# Patient Record
Sex: Female | Born: 1950 | Race: Black or African American | Hispanic: No | Marital: Single | Smoking: Never smoker
Health system: Southern US, Community
[De-identification: ages and names within clinical notes are randomized; demographics above are authoritative.]

## PROBLEM LIST (undated history)

## (undated) HISTORY — PX: ABDOMINAL HYSTERECTOMY: SHX81

---

## 2015-05-11 ENCOUNTER — Emergency Department
Admission: EM | Admit: 2015-05-11 | Discharge: 2015-05-11 | Disposition: A | Discharge status: Home / Self Care | Payer: BC Managed Care – PPO | Attending: Emergency Medicine | Admitting: Emergency Medicine

## 2015-05-11 ENCOUNTER — Encounter: Payer: Self-pay | Admitting: Emergency Medicine

## 2015-05-11 DIAGNOSIS — F17200 Nicotine dependence, unspecified, uncomplicated: Secondary | ICD-10-CM | POA: Diagnosis not present

## 2015-05-11 DIAGNOSIS — R21 Rash and other nonspecific skin eruption: Secondary | ICD-10-CM | POA: Diagnosis present

## 2015-05-11 DIAGNOSIS — L03119 Cellulitis of unspecified part of limb: Secondary | ICD-10-CM

## 2015-05-11 DIAGNOSIS — L03115 Cellulitis of right lower limb: Secondary | ICD-10-CM | POA: Diagnosis not present

## 2015-05-11 MED ORDER — DEXAMETHASONE SODIUM PHOSPHATE 10 MG/ML IJ SOLN
10.0000 mg | Freq: Once | INTRAMUSCULAR | Status: AC
  Administered 2015-05-11: 10 mg via INTRAMUSCULAR
  Filled 2015-05-11: qty 1

## 2015-05-11 MED ORDER — CEPHALEXIN 500 MG PO CAPS: 500.0000 mg | ORAL_CAPSULE | Freq: Two times a day (BID) | ORAL | Status: AC

## 2015-05-11 MED ORDER — PREDNISONE 10 MG (21) PO TBPK: ORAL_TABLET | ORAL | Status: DC

## 2015-05-11 MED ORDER — HYDROXYZINE HCL 25 MG PO TABS: 25.0000 mg | ORAL_TABLET | Freq: Three times a day (TID) | ORAL | Status: DC | PRN

## 2015-05-11 NOTE — Discharge Instructions (Signed)

## 2015-05-11 NOTE — ED Notes (Signed)
Pt reports rash that started about 1-2 weeks ago and has been spreading.  Describes as painful and itching.  Denies fevers.

## 2015-05-11 NOTE — ED Notes (Signed)
Discussed discharge instructions, prescriptions, and follow-up care with patient. No questions or concerns at this time. Pt stable at discharge.  

## 2015-05-11 NOTE — ED Provider Notes (Signed)
CSN: [id number]     Arrival date & time 05/11/15  1516 History   First MD Initiated Contact with Patient 05/11/15 1604     Chief Complaint  Patient presents with  . Rash     HPI   65 year old female who presents to the emergency department for evaluation of a rash that started on her right lower extremity approximately 2-1/2 weeks ago. She reports that the rash started as a small patch that was very pruritic at night and has spread to both of her lower extremities including her feet, generalized over the lower extremities and is now wrapping around her mid section. She has tried using calamine lotion, hydrocortisone cream, peroxide, and wrapping the next lower extremities at night to prevent scratching. She reports having a long-standing history of eczema states that this rash is completely different from anything she has had in the past. She denies new exposures or new hygiene products. She denies recent illness or recent additions or discontinuation of any medications. No one else in the house has similar symptoms.  History reviewed. No pertinent past medical history. Past Surgical History  Procedure Laterality Date  . Abdominal hysterectomy     History reviewed. No pertinent family history. Social History  Substance Use Topics  . Smoking status: Current Every Day Smoker  . Smokeless tobacco: None  . Alcohol Use: No   OB History    No data available     Review of Systems  Constitutional: Negative for fever.  Respiratory: Negative.   Gastrointestinal: Negative for nausea and vomiting.  Musculoskeletal: Positive for joint swelling.  Skin: Positive for color change and rash.  Neurological: Negative.       Allergies  Review of patient's allergies indicates no known allergies.  Home Medications   Prior to Admission medications   Medication Sig Start Date End Date Taking? Authorizing Provider  cephALEXin (KEFLEX) 500 MG capsule Take 1 capsule (500 mg total) by mouth 2 (two)  times daily. 05/11/15 05/21/15  Chinita Pesterari B Mandrell Vangilder, FNP  hydrOXYzine (ATARAX/VISTARIL) 25 MG tablet Take 1 tablet (25 mg total) by mouth 3 (three) times daily as needed. 05/11/15   Chinita Pesterari B Nykira Reddix, FNP  predniSONE (STERAPRED UNI-PAK 21 TAB) 10 MG (21) TBPK tablet Take 6 tablets on day 1 Take 5 tablets on day 2 Take 4 tablets on day 3 Take 3 tablets on day 4 Take 2 tablets on day 5 Take 1 tablet on day 6 05/11/15   Fraser Busche B Deaundra Dupriest, FNP   BP 128/74 mmHg  Pulse 93  Temp(Src) 97.9 F (36.6 C) (Oral)  Resp 18  Ht 5\' 1"  (1.549 m)  Wt 90.719 kg  BMI 37.81 kg/m2  SpO2 100% Physical Exam  Constitutional: She is oriented to person, place, and time. She appears well-developed and well-nourished.  Eyes: EOM are normal.  Neck: Normal range of motion.  Cardiovascular: Normal rate.   Pulmonary/Chest: Effort normal and breath sounds normal.  Musculoskeletal: Normal range of motion. She exhibits edema.  Neurological: She is alert and oriented to person, place, and time.  Skin: Skin is warm and dry. Rash noted. Rash is maculopapular and vesicular. There is erythema.     Psychiatric: Her behavior is normal. Thought content normal.  Nursing note and vitals reviewed.   ED Course  Procedures (including critical care time) Labs Review Labs Reviewed - No data to display  Imaging Review No results found. I have personally reviewed and evaluated these images and lab results as part of my medical  decision-making.   EKG Interpretation None      MDM   Final diagnoses:  Rash and nonspecific skin eruption  Cellulitis of lower extremity, unspecified laterality    Patient was given 10 mg Decadron IM while in the emergency department and will be discharged home with a prescription for tapered dose of prednisone to be started tomorrow afternoon. She will also be given a prescription of hydroxyzine to be taken especially at night. She was encouraged to follow up with the dermatologist for symptoms that are  not resolving with these medications. She was advised to schedule an appointment with her primary care provider for any general health exam. She was advised to return to the emergency department for symptoms that change or worsen if she is unable to schedule an appointment with primary care or dermatology.    Chinita Pester, FNP 05/11/15 1720  Jeanmarie Plant, MD 05/11/15 2259

## 2017-03-15 ENCOUNTER — Other Ambulatory Visit: Payer: Self-pay

## 2017-03-15 ENCOUNTER — Encounter: Payer: Self-pay | Admitting: *Deleted

## 2017-03-15 ENCOUNTER — Ambulatory Visit (INDEPENDENT_AMBULATORY_CARE_PROVIDER_SITE_OTHER): Payer: Medicare Other

## 2017-03-15 ENCOUNTER — Ambulatory Visit
Admission: EM | Admit: 2017-03-15 | Discharge: 2017-03-15 | Disposition: A | Discharge status: Home / Self Care | Payer: Medicare Other | Attending: Family Medicine | Admitting: Family Medicine

## 2017-03-15 DIAGNOSIS — R05 Cough: Secondary | ICD-10-CM

## 2017-03-15 DIAGNOSIS — R059 Cough, unspecified: Secondary | ICD-10-CM

## 2017-03-15 DIAGNOSIS — R5383 Other fatigue: Secondary | ICD-10-CM | POA: Diagnosis not present

## 2017-03-15 MED ORDER — HYDROCOD POLST-CPM POLST ER 10-8 MG/5ML PO SUER: 5.0000 mL | Freq: Two times a day (BID) | ORAL | 0 refills | Status: DC | PRN

## 2017-03-15 MED ORDER — DOXYCYCLINE HYCLATE 100 MG PO CAPS: 100.0000 mg | ORAL_CAPSULE | Freq: Two times a day (BID) | ORAL | 0 refills | Status: DC

## 2017-03-15 MED ORDER — PREDNISONE 50 MG PO TABS: ORAL_TABLET | ORAL | 0 refills | Status: DC

## 2017-03-15 NOTE — Discharge Instructions (Signed)
This is due to bronchitis (likely acute on chronic).  Medications as prescribed.  Take care  Dr. Adriana Simasook

## 2017-03-15 NOTE — ED Triage Notes (Signed)
Patient started having symptoms of cough, congestion, and fatigue 4 days ago.

## 2017-03-15 NOTE — ED Provider Notes (Signed)
[facility]   CSN: [id number] Arrival date & time: 03/15/17  1220  History   Chief Complaint Chief Complaint  Patient presents with  . Cough  . Fatigue   HPI  67 year old female presents with the above complaints..  Patient reports a 3-4-day history of cough.  Productive.  No associated shortness of breath.  Associated fatigue.  She denies any fever but is febrile here.  She reports recent sick contact in the flu.  No known exacerbating relieving factors.  No other associated symptoms.  No other complaints.  Past Surgical History:  Procedure Laterality Date  . ABDOMINAL HYSTERECTOMY     OB History    No data available     Home Medications    Prior to Admission medications   Medication Sig Start Date End Date Taking? Authorizing Provider  chlorpheniramine-HYDROcodone (TUSSIONEX PENNKINETIC ER) 10-8 MG/5ML SUER Take 5 mLs by mouth every 12 (twelve) hours as needed. 03/15/17   Tommie Sams, DO  doxycycline (VIBRAMYCIN) 100 MG capsule Take 1 capsule (100 mg total) by mouth 2 (two) times daily. 03/15/17   Tommie Sams, DO  predniSONE (DELTASONE) 50 MG tablet 1 tablet daily x 5 days. 03/15/17   Tommie Sams, DO   Family History Family History  Problem Relation Age of Onset  . Hypertension Mother    Social History Social History   Tobacco Use  . Smoking status: Current Some Day Smoker  . Smokeless tobacco: Never Used  Substance Use Topics  . Alcohol use: No  . Drug use: No    Allergies   Patient has no known allergies.   Review of Systems Review of Systems  Constitutional: Positive for fatigue.  Respiratory: Positive for cough. Negative for shortness of breath.    Physical Exam Triage Vital Signs ED Triage Vitals  Enc Vitals Group     BP 03/15/17 1320 (!) 117/46     Pulse Rate 03/15/17 1320 95     Resp 03/15/17 1320 16     Temp 03/15/17 1320 (!) 101 F (38.3 C)     Temp Source 03/15/17 1320 Oral     SpO2 03/15/17 1320 94 %     Weight 03/15/17  1322 210 lb (95.3 kg)     Height 03/15/17 1322 5\' 4"  (1.626 m)     Head Circumference --      Peak Flow --      Pain Score 03/15/17 1322 0     Pain Loc --      Pain Edu? --      Excl. in [place]? --    Updated Vital Signs BP (!) 117/46 (BP Location: Right Arm)   Pulse 95   Temp (!) 101 F (38.3 C) (Oral)   Resp 16   Ht 5\' 4"  (1.626 m)   Wt 210 lb (95.3 kg)   SpO2 94%   BMI 36.05 kg/m     Physical Exam  Constitutional: She is oriented to person, place, and time. She appears well-developed and well-nourished. No distress.  HENT:  Head: Normocephalic and atraumatic.  Mouth/Throat: Oropharynx is clear and moist.  Eyes: Conjunctivae are normal. Right eye exhibits no discharge. Left eye exhibits no discharge.  Cardiovascular: Normal rate and regular rhythm.  Pulmonary/Chest: Effort normal.  Basilar crackles noted.  Neurological: She is alert and oriented to person, place, and time.  Psychiatric: She has a normal mood and affect. Her behavior is normal.  Vitals reviewed.  UC Treatments / Results  Labs (  all labs ordered are listed, but only abnormal results are displayed) Labs Reviewed - No data to display  EKG  EKG Interpretation None       Radiology Dg Chest 2 View  Result Date: 03/15/2017 CLINICAL DATA:  Cough.  Fever.  Smoker with history of asthma. EXAM: CHEST  2 VIEW COMPARISON:  None. FINDINGS: Moderate mid and lower thoracic spondylosis. Midline trachea. Borderline cardiomegaly. Atherosclerosis in the transverse aorta. Mediastinal contours otherwise within normal limits. No pleural effusion or pneumothorax. Mild to moderate basilar predominant interstitial thickening. No lobar consolidation. IMPRESSION: No acute cardiopulmonary disease. Peribronchial thickening which may relate to chronic bronchitis or smoking. Aortic Atherosclerosis (ICD10-I70.0). Electronically Signed   By: Jeronimo GreavesKyle  Talbot M.D.   On: 03/15/2017 14:27    Procedures Procedures (including critical care  time)  Medications Ordered in UC Medications - No data to display   Initial Impression / Assessment and Plan / UC Course  I have reviewed the triage vital signs and the nursing notes.  Pertinent labs & imaging results that were available during my care of the patient were reviewed by me and considered in my medical decision making (see chart for details).     67 year old female presents with cough and fatigue.  Given fever and lung findings, x-ray obtained and revealed findings of chronic bronchitis.  I suspect she has COPD due to underlying tobacco abuse.  As a result, I am treating her with doxycycline, prednisone, Tussionex.  Final Clinical Impressions(s) / UC Diagnoses   Final diagnoses:  Cough    ED Discharge Orders        Ordered    doxycycline (VIBRAMYCIN) 100 MG capsule  2 times daily     03/15/17 1440    predniSONE (DELTASONE) 50 MG tablet     03/15/17 1440    chlorpheniramine-HYDROcodone (TUSSIONEX PENNKINETIC ER) 10-8 MG/5ML SUER  Every 12 hours PRN     03/15/17 1440     Controlled Substance Prescriptions  Controlled Substance Registry consulted? No   Tommie SamsCook, Christen Wardrop G, DO 03/15/17 1500

## 2018-10-04 ENCOUNTER — Other Ambulatory Visit: Payer: Self-pay | Admitting: Internal Medicine

## 2018-10-04 DIAGNOSIS — Z20822 Contact with and (suspected) exposure to covid-19: Secondary | ICD-10-CM

## 2018-10-05 LAB — NOVEL CORONAVIRUS, NAA: SARS-CoV-2, NAA: NOT DETECTED

## 2019-02-05 IMAGING — CR DG CHEST 2V
3 series · 3 of 3 positions shown · non-contrast
Comparison: None.

CLINICAL DATA: Cough.  Fever.  Smoker with history of asthma.

EXAM:
CHEST  2 VIEW

[chest pa]
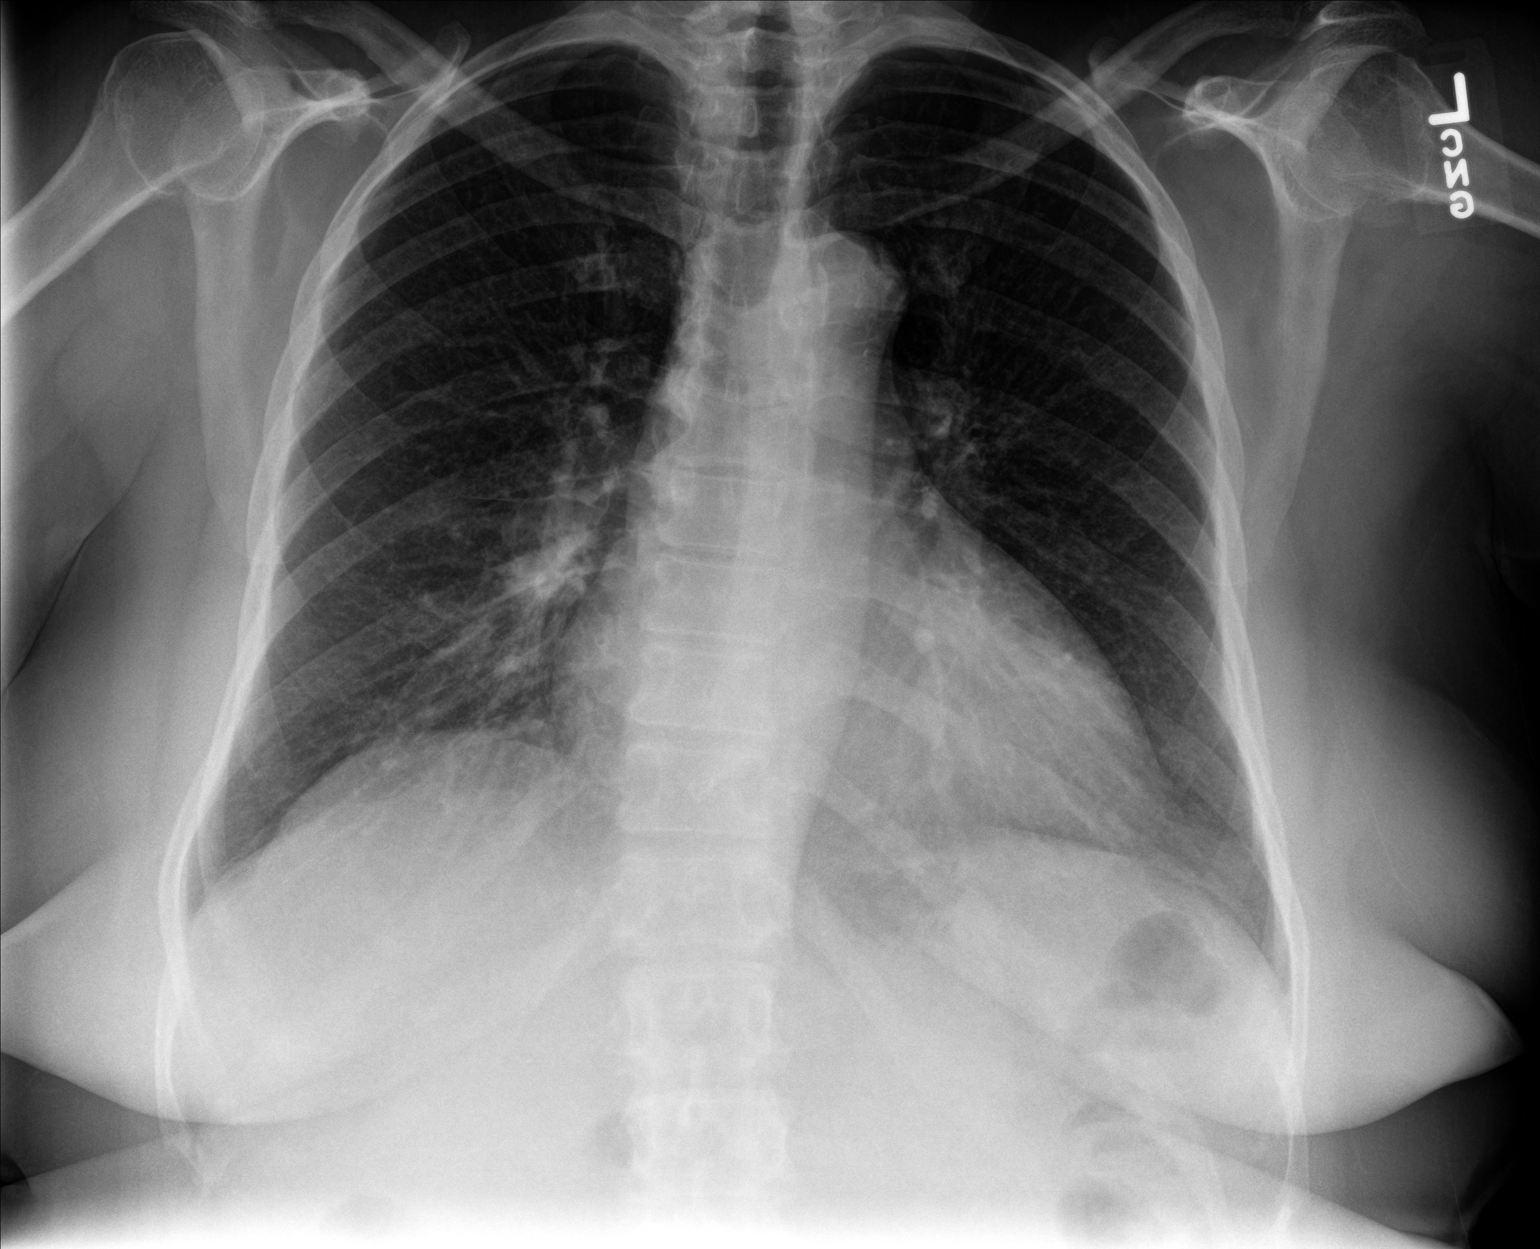

[chest lat (1 of 2)]
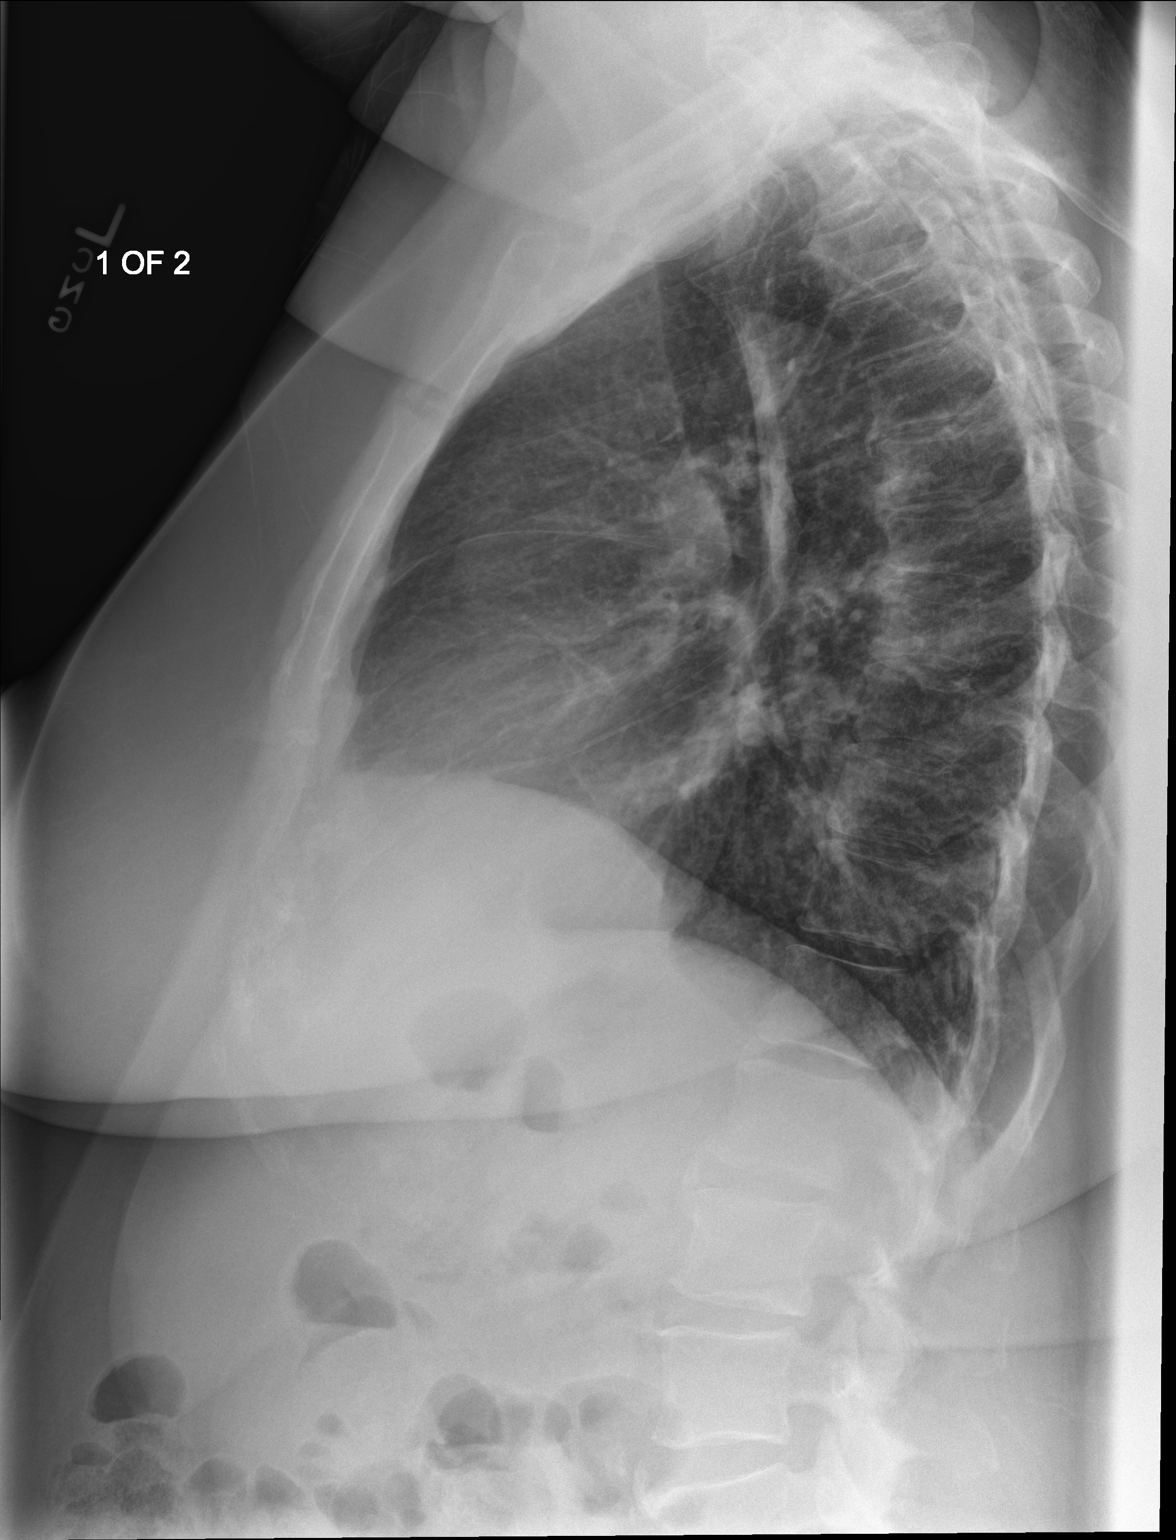

[chest lat (2 of 2)]
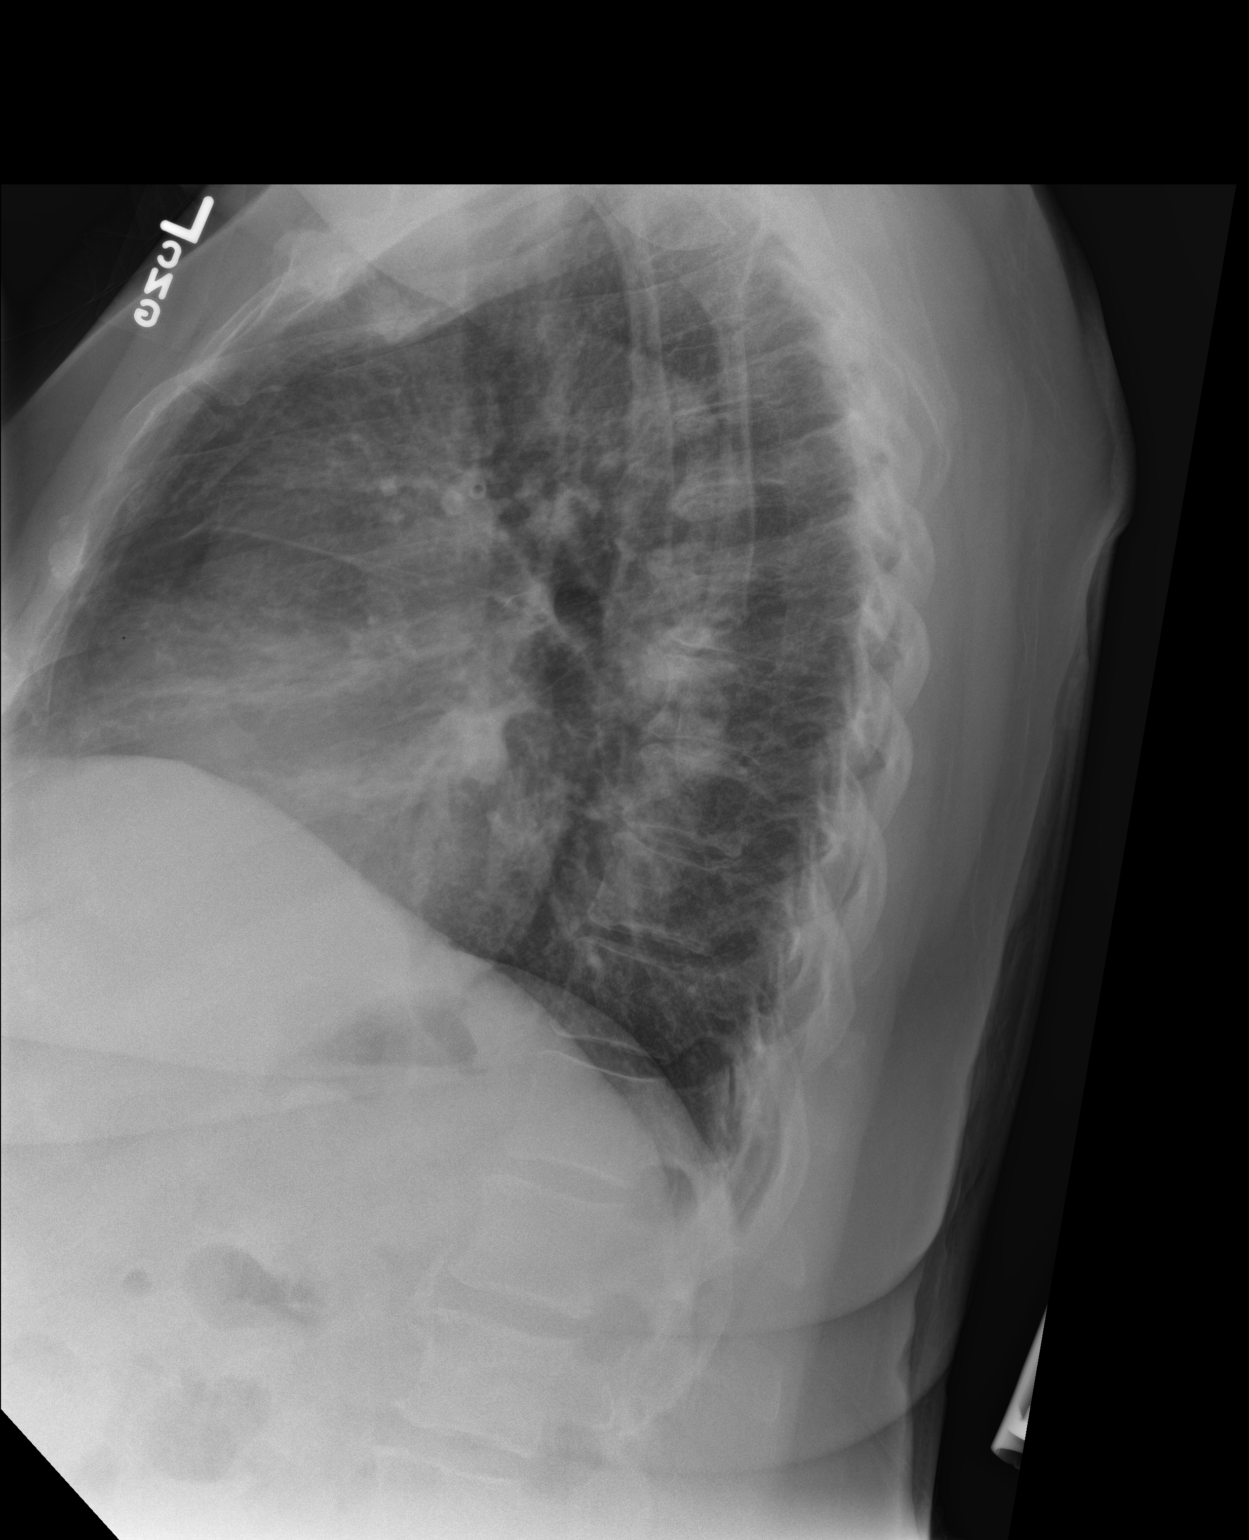

[3 of 3 positions shown; findings below may reference images not displayed]

FINDINGS: Moderate mid and lower thoracic spondylosis. Midline trachea.
Borderline cardiomegaly. Atherosclerosis in the transverse aorta.
Mediastinal contours otherwise within normal limits. No pleural
effusion or pneumothorax. Mild to moderate basilar predominant
interstitial thickening. No lobar consolidation.
IMPRESSION: No acute cardiopulmonary disease.

Peribronchial thickening which may relate to chronic bronchitis or
smoking.

Aortic Atherosclerosis ([id number].W).

## 2019-06-17 ENCOUNTER — Emergency Department
Admission: EM | Admit: 2019-06-17 | Discharge: 2019-06-18 | Disposition: A | Discharge status: Home / Self Care | Payer: Medicare PPO | Attending: Student | Admitting: Student

## 2019-06-17 ENCOUNTER — Other Ambulatory Visit: Payer: Self-pay

## 2019-06-17 ENCOUNTER — Encounter: Payer: Self-pay | Admitting: Emergency Medicine

## 2019-06-17 ENCOUNTER — Emergency Department: Payer: Medicare PPO

## 2019-06-17 DIAGNOSIS — R059 Cough, unspecified: Secondary | ICD-10-CM

## 2019-06-17 DIAGNOSIS — R05 Cough: Secondary | ICD-10-CM

## 2019-06-17 DIAGNOSIS — R5383 Other fatigue: Secondary | ICD-10-CM | POA: Diagnosis present

## 2019-06-17 DIAGNOSIS — R531 Weakness: Secondary | ICD-10-CM

## 2019-06-17 DIAGNOSIS — U071 COVID-19: Secondary | ICD-10-CM | POA: Diagnosis not present

## 2019-06-17 DIAGNOSIS — F17200 Nicotine dependence, unspecified, uncomplicated: Secondary | ICD-10-CM | POA: Insufficient documentation

## 2019-06-17 DIAGNOSIS — Z20822 Contact with and (suspected) exposure to covid-19: Secondary | ICD-10-CM

## 2019-06-17 LAB — CBC
HCT: 44.8 % (ref 36.0–46.0)
Hemoglobin: 14.5 g/dL (ref 12.0–15.0)
MCH: 25.3 pg — ABNORMAL LOW (ref 26.0–34.0)
MCHC: 32.4 g/dL (ref 30.0–36.0)
MCV: 78.3 fL — ABNORMAL LOW (ref 80.0–100.0)
Platelets: 142 10*3/uL — ABNORMAL LOW (ref 150–400)
RBC: 5.72 MIL/uL — ABNORMAL HIGH (ref 3.87–5.11)
RDW: 15.3 % (ref 11.5–15.5)
WBC: 4.2 10*3/uL (ref 4.0–10.5)
nRBC: 0 % (ref 0.0–0.2)

## 2019-06-17 LAB — URINALYSIS, COMPLETE (UACMP) WITH MICROSCOPIC
Bilirubin Urine: NEGATIVE
Glucose, UA: NEGATIVE mg/dL
Hgb urine dipstick: NEGATIVE
Ketones, ur: 20 mg/dL — AB
Leukocytes,Ua: NEGATIVE
Nitrite: NEGATIVE
Protein, ur: >=300 mg/dL — AB
Specific Gravity, Urine: 1.032 — ABNORMAL HIGH (ref 1.005–1.030)
pH: 5 (ref 5.0–8.0)

## 2019-06-17 LAB — BASIC METABOLIC PANEL
Anion gap: 12 (ref 5–15)
BUN: 19 mg/dL (ref 8–23)
CO2: 24 mmol/L (ref 22–32)
Calcium: 8.1 mg/dL — ABNORMAL LOW (ref 8.9–10.3)
Chloride: 101 mmol/L (ref 98–111)
Creatinine, Ser: 1.22 mg/dL — ABNORMAL HIGH (ref 0.44–1.00)
GFR calc Af Amer: 53 mL/min — ABNORMAL LOW (ref >60)
GFR calc non Af Amer: 45 mL/min — ABNORMAL LOW (ref >60)
Glucose, Bld: 80 mg/dL (ref 70–99)
Potassium: 3.4 mmol/L — ABNORMAL LOW (ref 3.5–5.1)
Sodium: 137 mmol/L (ref 135–145)

## 2019-06-17 MED ORDER — SODIUM CHLORIDE 0.9% FLUSH: 3.0000 mL | Freq: Once | INTRAVENOUS | Status: DC

## 2019-06-17 NOTE — ED Notes (Signed)
Pt again updated on wait. Pt continues to states she would like to leave. Pt informed that she is free to leave at any time. Pt's daughter at bedside encouraging pt to stay. Pt informed that if she chooses to leave to please let staff know. Pt verbalizes understanding.

## 2019-06-17 NOTE — ED Provider Notes (Signed)
[facility] Emergency Department Provider Note  ____________________________________________   First MD Initiated Contact with Patient 06/17/19 2259     (approximate)  I have reviewed the triage vital signs and the nursing notes.  History  Chief Complaint Dizziness, Fatigue, and Cough    HPI Sharon Ayala is a 69 y.o. female no significant medical history, though admits that she does not regularly see doctors, who presents to the emergency department for 1 week of generalized fatigue, lightheadedness, non-productive cough, sinus congestion/pressure, decreased appetite.  Family (present at bedside) noticed she seemed overall more weak and fatigued than normal today (got tired with walking which is atypical for her) and insisted she seek care.  Reports symptoms have been constant since onset for the past week.  Has not taken anything for her symptoms. Nothing seems to make it better or worse. Reports subjective fever today.  No chest pain, vomiting, diarrhea, dysuria.  No isolated weakness, numbness, tingling.  No known sick contacts.  Has not been vaccinated against COVID.  Self swabbed for COVID testing at a [facility] a few days ago but has not received the results yet.   Past Medical Hx History reviewed. No pertinent past medical history.  Problem List There are no problems to display for this patient.   Past Surgical Hx Past Surgical History:  Procedure Laterality Date  . ABDOMINAL HYSTERECTOMY      Medications Prior to Admission medications   Medication Sig Start Date End Date Taking? Authorizing Provider  chlorpheniramine-HYDROcodone (TUSSIONEX PENNKINETIC ER) 10-8 MG/5ML SUER Take 5 mLs by mouth every 12 (twelve) hours as needed. 03/15/17   Coral Spikes, DO  doxycycline (VIBRAMYCIN) 100 MG capsule Take 1 capsule (100 mg total) by mouth 2 (two) times daily. 03/15/17   Coral Spikes, DO  predniSONE (DELTASONE) 50 MG tablet 1 tablet daily x 5 days. 03/15/17    Coral Spikes, DO    Allergies Patient has no known allergies.  Family Hx Family History  Problem Relation Age of Onset  . Hypertension Mother     Social Hx Social History   Tobacco Use  . Smoking status: Current Some Day Smoker  . Smokeless tobacco: Never Used  Substance Use Topics  . Alcohol use: No  . Drug use: No      Review of Systems  Constitutional: Positive for subjective fever, fatigue, lightheadedness, decreased appetite. Eyes: Negative for visual changes. ENT: Negative for sore throat. Cardiovascular: Negative for chest pain. Respiratory: Positive for cough. Gastrointestinal: Negative for nausea. Negative for vomiting.  Genitourinary: Negative for dysuria. Musculoskeletal: Negative for leg swelling. Skin: Negative for rash. Neurological: Negative for headaches.   Physical Exam  Vital Signs: ED Triage Vitals  Enc Vitals Group     BP 06/17/19 1644 (!) 121/94     Pulse Rate 06/17/19 1644 88     Resp 06/17/19 1644 18     Temp 06/17/19 1644 99.7 F (37.6 C)     Temp Source 06/17/19 1644 Oral     SpO2 06/17/19 1644 100 %     Weight 06/17/19 1645 280 lb (127 kg)     Height 06/17/19 1645 5\' 4"  (1.626 m)     Head Circumference --      Peak Flow --      Pain Score 06/17/19 1644 8     Pain Loc --      Pain Edu? --      Excl. in [place]? --     Constitutional:  Alert and oriented. Well appearing. NAD.  Head: Normocephalic. Atraumatic.  Nontender to palpation of maxillary and frontal sinuses. Eyes: Conjunctivae clear. Sclera anicteric. Pupils equal and symmetric. Ears: Cerumen in bilateral EAC. Nose: No masses or lesions. No congestion or rhinorrhea. Mouth/Throat: Wearing mask.  Neck: No stridor. Trachea midline.  Cardiovascular: Normal rate, regular rhythm. Extremities well perfused. Respiratory: Normal respiratory effort.  Lungs CTAB. Gastrointestinal: Soft. Non-distended. Non-tender.  Genitourinary: Deferred. Musculoskeletal: No lower extremity edema.  No deformities. Neurologic:  Normal speech and language. No gross focal or lateralizing neurologic deficits are appreciated. Alert and oriented.  Face symmetric.  Tongue midline.  Cranial nerves II through XII intact. UE and LE strength 5/5 and symmetric. UE and LE SILT.  Skin: Skin is warm, dry and intact. No rash noted. Psychiatric: Mood and affect are appropriate for situation.  EKG  Personally reviewed and interpreted by myself.   Date: 06/17/19 Time: 1649 Rate: 87 Rhythm: sinus Axis: left Intervals: WNL TWI III, aVF No priors No STEMI    Radiology  Personally reviewed available imaging myself.   CXR - IMPRESSION: Bilateral hazy and interstitial densities may represent edema or pneumonia.     Procedures  Procedure(s) performed (including critical care):  Procedures   Initial Impression / Assessment and Plan / MDM / ED Course  69 y.o. female who presents to the ED for 1 week of generalized fatigue, cough, sinus congestion, decreased appetite.  Ddx: COVID, UTI, electrolyte abnormality, anemia, other viral process.  NT to palpation of the sinuses, doubt acute bacterial sinusitis.  Will plan for labs, repeat COVID swab, XR  Labs reveal slightly elevated creatinine at 1.22, unclear what her baseline is that she does not regularly seek care, but reasonable to assume mild AKI in the setting of her decreased intake, advised adequate oral hydration.  No significant anemia.  Mild thrombocytopenia.  Urine with small amount of ketones, consistent with her decreased intake, no evidence of infection.  Clinical Course as of Jun 17 249  Mon Jun 18, 2019  0027 CXR reveals bilateral hazy and interstitial densities may represent edema or pneumonia.  In the setting of her symptoms, high suspicion for COVID.  Added on procalcitonin and BNP.  At this time, patient does not wish to wait for her COVID results and prefers to be discharged since it is getting late.  Given her blood work  is otherwise without actionable derangements and she has remained stable from a hemodynamic and respiratory perspective, feels is reasonable.  We will plan to discharge with prescription for antibiotics for possible atypical pneumonia to take pending her COVID swab.  If her swab is negative, will assume her chest x-ray findings represent bacterial etiology & atypical PNA (unlikely edema given normal BNP) and she should complete treatment with antibiotics.  If her swab is positive, CXR findings would be explained by this and she would not need the antibiotics.  We will also give a referral for her to establish care and follow-up with a PCP.  Patient voices understanding and is comfortable w/ plan.  Given return precautions.   [SM]    Clinical Course User Index [SM] Miguel Aschoff., MD      _______________________________   As part of my medical decision making I have reviewed available labs, radiology tests, reviewed old records/performed chart review, obtained additional history from family.    Final Clinical Impression(s) / ED Diagnosis  Final diagnoses:  Weakness  Cough  Suspected COVID-19 virus infection  Note:  This document was prepared using Dragon voice recognition software and may include unintentional dictation errors.   Miguel Aschoff., MD 06/18/19 [phone]

## 2019-06-17 NOTE — ED Triage Notes (Signed)
Pt to ER states intermittent dizziness, cough and fatigue for last week.  Pt states "at first I thought it was just allergies".  Pt denies chest pain, shortness of breath, n/v. Reports headache and ears are stuffy to the point of effecting her hearing.

## 2019-06-18 ENCOUNTER — Telehealth: Payer: Self-pay

## 2019-06-18 LAB — RESPIRATORY PANEL BY RT PCR (FLU A&B, COVID)
Influenza A by PCR: NEGATIVE
Influenza B by PCR: NEGATIVE
SARS Coronavirus 2 by RT PCR: POSITIVE — AB

## 2019-06-18 LAB — PROCALCITONIN: Procalcitonin: 0.18 ng/mL

## 2019-06-18 LAB — BRAIN NATRIURETIC PEPTIDE: B Natriuretic Peptide: 28 pg/mL (ref 0.0–100.0)

## 2019-06-18 MED ORDER — AMOXICILLIN-POT CLAVULANATE 875-125 MG PO TABS: 1.0000 | ORAL_TABLET | Freq: Two times a day (BID) | ORAL | 0 refills | Status: AC

## 2019-06-18 MED ORDER — AZITHROMYCIN 250 MG PO TABS: 250.0000 mg | ORAL_TABLET | Freq: Every day | ORAL | 0 refills | Status: AC

## 2019-06-18 NOTE — ED Notes (Signed)
06/18/19 02:55 Attempted to notify patient of positive Covid results but no answer at this time.

## 2019-06-18 NOTE — Telephone Encounter (Signed)
Pt notified of positive COVID-19 test results. Pt verbalized understanding. Pt reports that they are feeling better.Pt advised to remain in self quarantine until at least 10 days since symptom onset And 3 consecutive days fever free without antipyretics And improvement in respiratory symptoms. Patient advised to utilize over the counter medications to treat symptoms. Pt advised to seek treatment in the ED if respiratory issues/distress develops.Pt advised they should only leave home to seek and medical care and must wear a mask in public. Pt instructed to limit contact with family members or caregivers in the home. Pt advised to practice social distancing and to continue to use good preventative care measures such has frequent hand washing, staying out of crowds and cleaning hard surfaces frequently touched in the home.Pt informed that the health department will likely follow up and may have additional recommendations. Will notify  [facility].

## 2019-06-18 NOTE — Discharge Instructions (Addendum)
Thank you for letting us take care of you in the emergency department today. At this time, your COVID test is pending. They should result in the next few hours. You can check your MyChart online for the results, you will also receive a call if they are positive.   If your swab is POSITIVE, you do not need to take the antibiotics. If the swab is NEGATIVE please fill and take your antibiotics.   Please continue to take any regular, prescribed medications.   Please follow up with: Your primary care doctor to review your ER visit and follow up on your symptoms.    Please return to the ER for any new or worsening symptoms.

## 2021-05-09 IMAGING — CR DG CHEST 2V
2 series · 2 of 2 positions shown · non-contrast
Comparison: Chest radiograph dated 03/15/2017

CLINICAL DATA: 68-year-old female with shortness of breath.

EXAM:
CHEST - 2 VIEW

[chest pa]
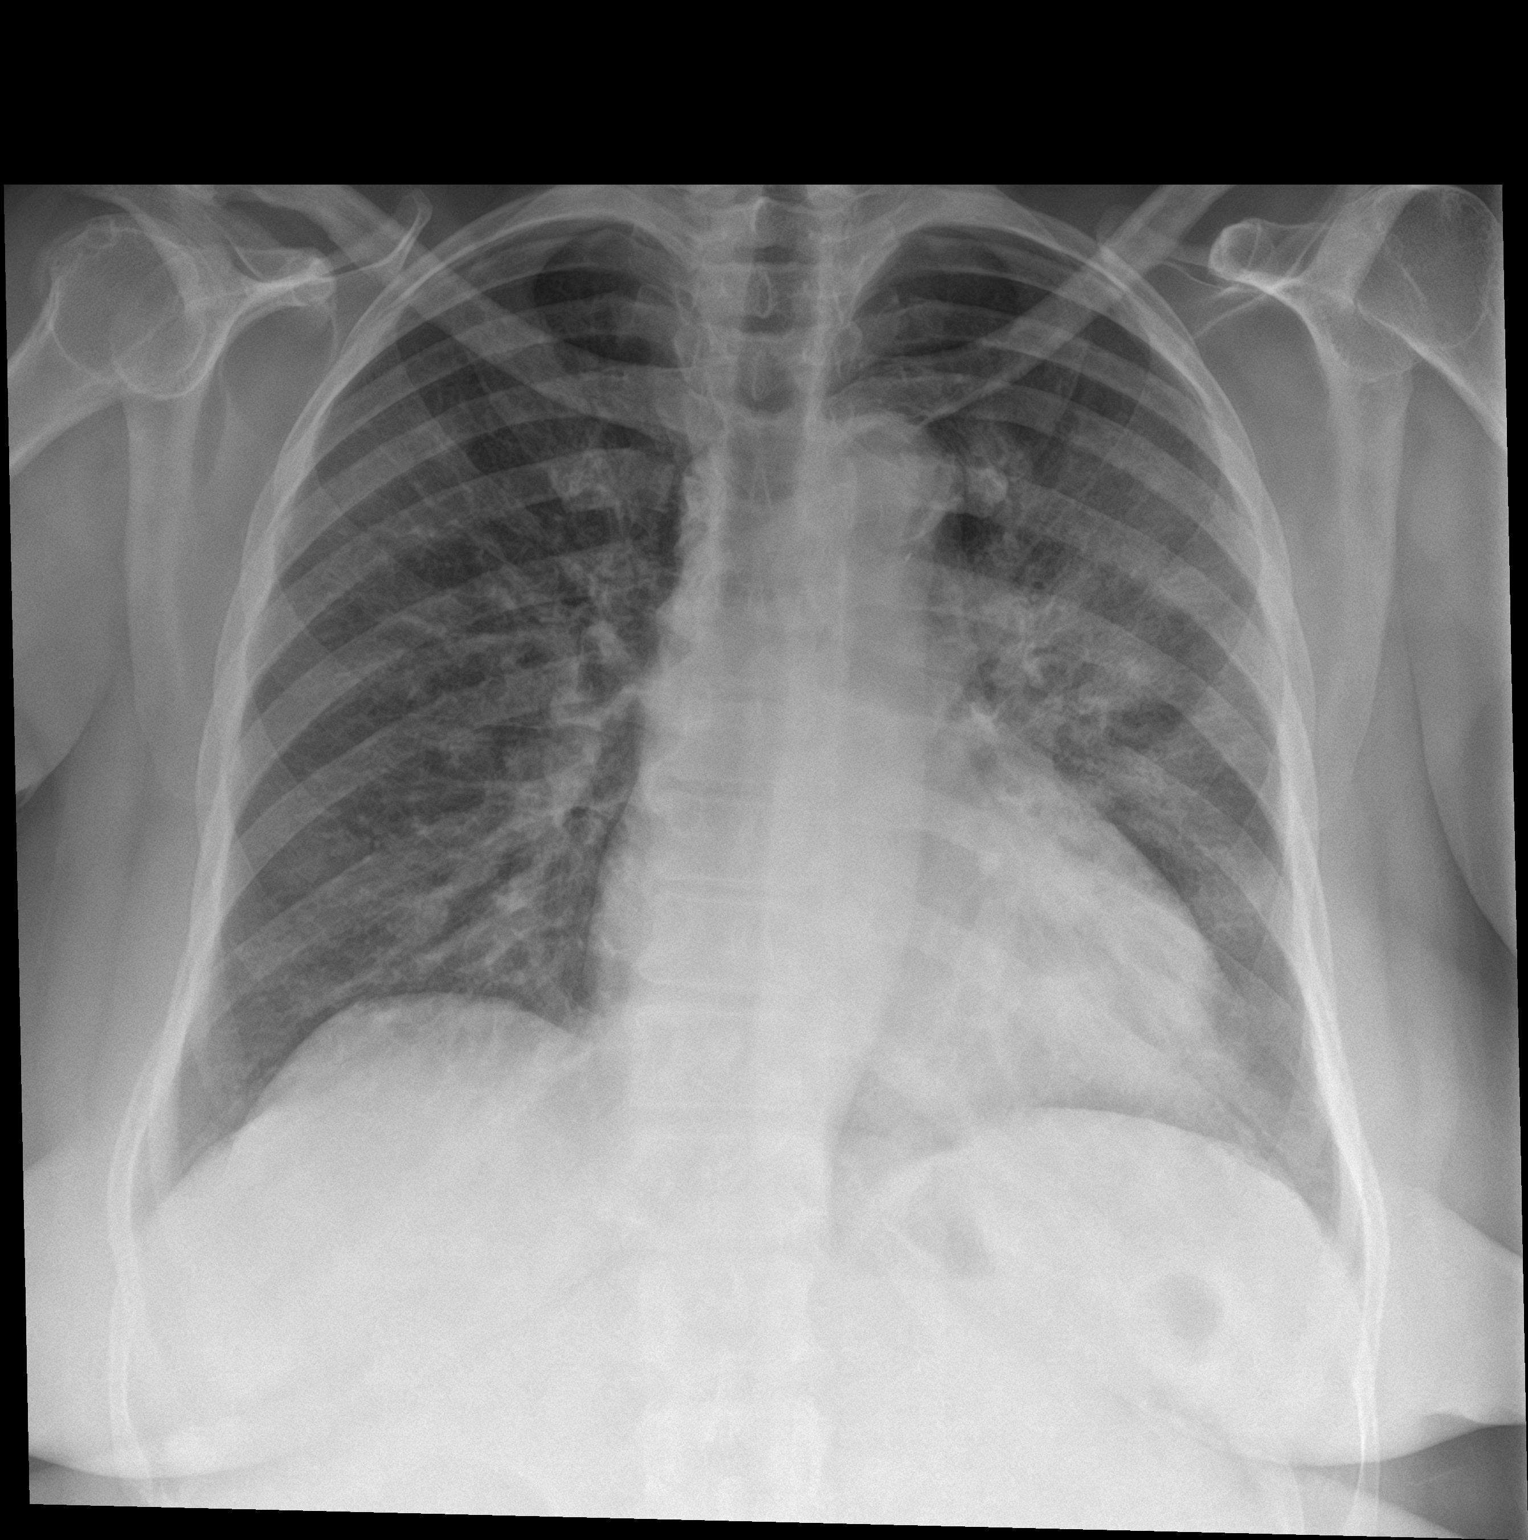

[chest lat]
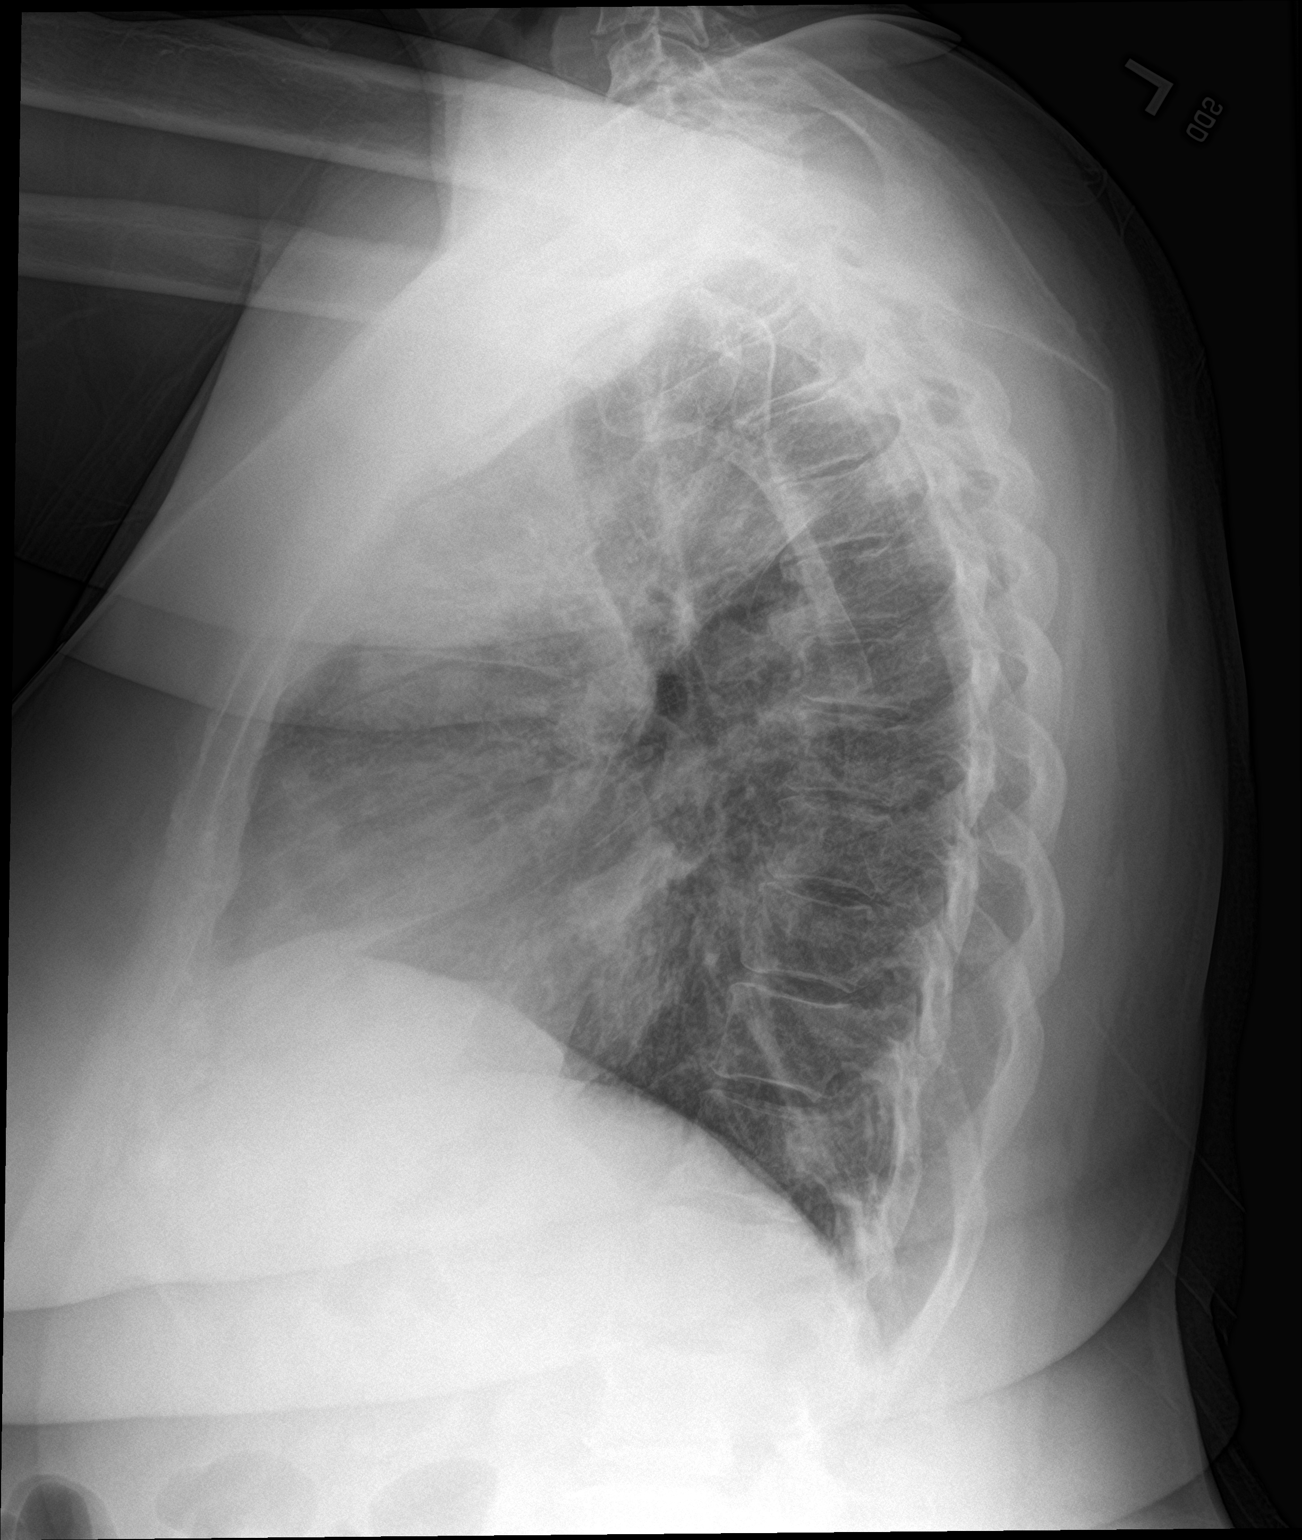

[2 of 2 positions shown; findings below may reference images not displayed]

FINDINGS: Bilateral hazy and interstitial densities may represent edema or
pneumonia. Clinical correlation is recommended. There is no focal
consolidation, pleural effusion, pneumothorax. Borderline
cardiomegaly. Atherosclerotic calcification of the aorta. No acute
osseous pathology.
IMPRESSION: Bilateral hazy and interstitial densities may represent edema or
pneumonia.

## 2022-10-26 ENCOUNTER — Ambulatory Visit
Admission: EM | Admit: 2022-10-26 | Discharge: 2022-10-26 | Disposition: A | Discharge status: Home / Self Care | Payer: Medicare PPO | Attending: Emergency Medicine | Admitting: Emergency Medicine

## 2022-10-26 DIAGNOSIS — U071 COVID-19: Secondary | ICD-10-CM | POA: Insufficient documentation

## 2022-10-26 DIAGNOSIS — F17200 Nicotine dependence, unspecified, uncomplicated: Secondary | ICD-10-CM | POA: Insufficient documentation

## 2022-10-26 DIAGNOSIS — R490 Dysphonia: Secondary | ICD-10-CM | POA: Diagnosis present

## 2022-10-26 LAB — SARS CORONAVIRUS 2 BY RT PCR: SARS Coronavirus 2 by RT PCR: POSITIVE — AB

## 2022-10-26 NOTE — ED Triage Notes (Signed)
Patient states that she woke up hoarse this morning. No sore throat.

## 2022-10-26 NOTE — ED Provider Notes (Signed)
[facility]    CSN: [id number] Arrival date & time: 10/26/22  1743      History   Chief Complaint No chief complaint on file.   HPI Sharon Ayala is a 72 y.o. female.   72 year old female, Sharon Ayala, presents to urgent care for hoarseness that she woke up with this morning.  Daughter sent patient to urgent care for COVID test.  The history is provided by the patient. No language interpreter was used.    History reviewed. No pertinent past medical history.  Patient Active Problem List   Diagnosis Date Noted   Hoarseness 10/26/2022   Smoker 10/26/2022    Past Surgical History:  Procedure Laterality Date   ABDOMINAL HYSTERECTOMY      OB History   No obstetric history on file.      Home Medications    Prior to Admission medications   Medication Sig Start Date End Date Taking? Authorizing Provider  chlorpheniramine-HYDROcodone (TUSSIONEX PENNKINETIC ER) 10-8 MG/5ML SUER Take 5 mLs by mouth every 12 (twelve) hours as needed. 03/15/17   Tommie Sams, DO  doxycycline (VIBRAMYCIN) 100 MG capsule Take 1 capsule (100 mg total) by mouth 2 (two) times daily. 03/15/17   Tommie Sams, DO  predniSONE (DELTASONE) 50 MG tablet 1 tablet daily x 5 days. 03/15/17   Tommie Sams, DO    Family History Family History  Problem Relation Age of Onset   Hypertension Mother     Social History Social History   Tobacco Use   Smoking status: Some Days   Smokeless tobacco: Never  Vaping Use   Vaping status: Never Used  Substance Use Topics   Alcohol use: No   Drug use: No     Allergies   Patient has no known allergies.   Review of Systems Review of Systems  Constitutional:  Negative for fever.  HENT:  Positive for voice change.   All other systems reviewed and are negative.    Physical Exam Triage Vital Signs ED Triage Vitals  Encounter Vitals Group     BP 10/26/22 1815 (!) 183/60     Systolic BP Percentile --      Diastolic BP Percentile --       Pulse Rate 10/26/22 1815 73     Resp 10/26/22 1815 18     Temp 10/26/22 1815 98.4 F (36.9 C)     Temp Source 10/26/22 1815 Oral     SpO2 10/26/22 1815 98 %     Weight --      Height --      Head Circumference --      Peak Flow --      Pain Score 10/26/22 1814 0     Pain Loc --      Pain Education --      Exclude from Growth Chart --    No data found.  Updated Vital Signs BP (!) 183/60 (BP Location: Left Arm)   Pulse 73   Temp 98.4 F (36.9 C) (Oral)   Resp 18   SpO2 98%   Visual Acuity Right Eye Distance:   Left Eye Distance:   Bilateral Distance:    Right Eye Near:   Left Eye Near:    Bilateral Near:     Physical Exam Vitals and nursing note reviewed.  Constitutional:      Appearance: Normal appearance. She is well-developed and well-groomed.  Cardiovascular:     Rate and Rhythm: Normal rate and  regular rhythm.     Pulses: Normal pulses.     Heart sounds: Normal heart sounds.  Pulmonary:     Effort: Pulmonary effort is normal.     Breath sounds: Normal breath sounds and air entry.  Neurological:     General: No focal deficit present.     Mental Status: She is alert and oriented to person, place, and time.     GCS: GCS eye subscore is 4. GCS verbal subscore is 5. GCS motor subscore is 6.  Psychiatric:        Attention and Perception: Attention normal.        Mood and Affect: Mood normal.        Speech: Speech normal.        Behavior: Behavior normal. Behavior is cooperative.      UC Treatments / Results  Labs (all labs ordered are listed, but only abnormal results are displayed) Labs Reviewed  SARS CORONAVIRUS 2 BY RT PCR - Abnormal; Notable for the following components:      Result Value   SARS Coronavirus 2 by RT PCR POSITIVE (*)    All other components within normal limits    EKG   Radiology No results found.  Procedures Procedures (including critical care time)  Medications Ordered in UC Medications - No data to display  Initial  Impression / Assessment and Plan / UC Course  I have reviewed the triage vital signs and the nursing notes.  Pertinent labs & imaging results that were available during my care of the patient were reviewed by me and considered in my medical decision making (see chart for details).  Clinical Course as of 10/26/22 2059  Tue Oct 26, 2022  2057 Called pt to notify her she is Covid positve, supportive meds at home, push fluids.  Patient verbalized understanding's provider [JD]    Clinical Course User Index [JD] Asante Ritacco, Para March, NP   Discussed exam findings and plan of care with patient, strict go to ER precautions given.   Patient verbalized understanding to this provider.  Ddx : Viral illness, COVID, smoker Final Clinical Impressions(s) / UC Diagnoses   Final diagnoses:  Hoarseness  Smoker     Discharge Instructions      Most likely you have a viral illness: no antibiotic as indicated at this time, May treat with OTC meds of choice. Make sure to drink plenty of fluids to stay hydrated(gatorade, water, popsicles,jello,etc), avoid caffeine products. Follow up with PCP. Return as needed. Check my chart for results.     ED Prescriptions   None    PDMP not reviewed this encounter.   Clancy Gourd, NP 10/26/22 2059

## 2022-10-26 NOTE — Discharge Instructions (Addendum)
Most likely you have a viral illness: no antibiotic as indicated at this time, May treat with OTC meds of choice. Make sure to drink plenty of fluids to stay hydrated(gatorade, water, popsicles,jello,etc), avoid caffeine products. Follow up with PCP. Return as needed. Check my chart for results.

## 2023-01-05 ENCOUNTER — Telehealth: Payer: Self-pay

## 2023-01-05 ENCOUNTER — Emergency Department: Payer: Medicare PPO

## 2023-01-05 ENCOUNTER — Inpatient Hospital Stay: Payer: Medicare PPO

## 2023-01-05 ENCOUNTER — Encounter
Admission: EM | Disposition: A | Discharge status: Other Acute Inpatient Hospital | Payer: Self-pay | Source: Home / Self Care | Attending: Pulmonary Disease

## 2023-01-05 ENCOUNTER — Emergency Department: Payer: Medicare PPO | Admitting: Anesthesiology

## 2023-01-05 ENCOUNTER — Other Ambulatory Visit: Payer: Self-pay

## 2023-01-05 ENCOUNTER — Encounter: Payer: Self-pay | Admitting: Anesthesiology

## 2023-01-05 ENCOUNTER — Inpatient Hospital Stay
Admission: EM | Admit: 2023-01-05 | Discharge: 2023-01-06 | DRG: 023 | Disposition: A | Discharge status: Other Acute Inpatient Hospital | Payer: Medicare PPO | Attending: Pulmonary Disease | Admitting: Pulmonary Disease

## 2023-01-05 DIAGNOSIS — G935 Compression of brain: Secondary | ICD-10-CM | POA: Diagnosis present

## 2023-01-05 DIAGNOSIS — R159 Full incontinence of feces: Secondary | ICD-10-CM | POA: Diagnosis present

## 2023-01-05 DIAGNOSIS — G936 Cerebral edema: Secondary | ICD-10-CM | POA: Diagnosis present

## 2023-01-05 DIAGNOSIS — I700 Atherosclerosis of aorta: Secondary | ICD-10-CM | POA: Diagnosis present

## 2023-01-05 DIAGNOSIS — G9340 Encephalopathy, unspecified: Secondary | ICD-10-CM | POA: Diagnosis present

## 2023-01-05 DIAGNOSIS — I611 Nontraumatic intracerebral hemorrhage in hemisphere, cortical: Secondary | ICD-10-CM | POA: Diagnosis present

## 2023-01-05 DIAGNOSIS — J9601 Acute respiratory failure with hypoxia: Secondary | ICD-10-CM | POA: Diagnosis present

## 2023-01-05 DIAGNOSIS — Z1152 Encounter for screening for COVID-19: Secondary | ICD-10-CM

## 2023-01-05 DIAGNOSIS — Z6841 Body Mass Index (BMI) 40.0 and over, adult: Secondary | ICD-10-CM | POA: Diagnosis not present

## 2023-01-05 DIAGNOSIS — I6200 Nontraumatic subdural hemorrhage, unspecified: Secondary | ICD-10-CM | POA: Diagnosis present

## 2023-01-05 DIAGNOSIS — R32 Unspecified urinary incontinence: Secondary | ICD-10-CM | POA: Diagnosis present

## 2023-01-05 DIAGNOSIS — R4182 Altered mental status, unspecified: Secondary | ICD-10-CM | POA: Diagnosis present

## 2023-01-05 DIAGNOSIS — R569 Unspecified convulsions: Secondary | ICD-10-CM

## 2023-01-05 DIAGNOSIS — I10 Essential (primary) hypertension: Secondary | ICD-10-CM | POA: Diagnosis present

## 2023-01-05 DIAGNOSIS — I2489 Other forms of acute ischemic heart disease: Secondary | ICD-10-CM | POA: Diagnosis present

## 2023-01-05 DIAGNOSIS — S0633AA Contusion and laceration of cerebrum, unspecified, with loss of consciousness status unknown, initial encounter: Secondary | ICD-10-CM

## 2023-01-05 DIAGNOSIS — H5702 Anisocoria: Secondary | ICD-10-CM | POA: Diagnosis not present

## 2023-01-05 DIAGNOSIS — S0631AA Contusion and laceration of right cerebrum with loss of consciousness status unknown, initial encounter: Principal | ICD-10-CM

## 2023-01-05 DIAGNOSIS — E278 Other specified disorders of adrenal gland: Secondary | ICD-10-CM | POA: Diagnosis present

## 2023-01-05 DIAGNOSIS — I615 Nontraumatic intracerebral hemorrhage, intraventricular: Secondary | ICD-10-CM

## 2023-01-05 DIAGNOSIS — E6689 Other obesity not elsewhere classified: Secondary | ICD-10-CM | POA: Diagnosis present

## 2023-01-05 DIAGNOSIS — Z049 Encounter for examination and observation for unspecified reason: Secondary | ICD-10-CM

## 2023-01-05 DIAGNOSIS — I619 Nontraumatic intracerebral hemorrhage, unspecified: Secondary | ICD-10-CM | POA: Diagnosis present

## 2023-01-05 HISTORY — PX: VENTRICULOSTOMY: SHX5377

## 2023-01-05 HISTORY — PX: CRANIOTOMY: SHX93

## 2023-01-05 LAB — COMPREHENSIVE METABOLIC PANEL
ALT: 18 U/L (ref 0–44)
ALT: 16 U/L (ref 0–44)
AST: 38 U/L (ref 15–41)
AST: 32 U/L (ref 15–41)
Albumin: 4.2 g/dL (ref 3.5–5.0)
Albumin: 3.7 g/dL (ref 3.5–5.0)
Alkaline Phosphatase: 88 U/L (ref 38–126)
Alkaline Phosphatase: 83 U/L (ref 38–126)
Anion gap: 15 (ref 5–15)
Anion gap: 12 (ref 5–15)
BUN: 12 mg/dL (ref 8–23)
BUN: 12 mg/dL (ref 8–23)
CO2: 20 mmol/L — ABNORMAL LOW (ref 22–32)
CO2: 23 mmol/L (ref 22–32)
Calcium: 8.9 mg/dL (ref 8.9–10.3)
Calcium: 8.2 mg/dL — ABNORMAL LOW (ref 8.9–10.3)
Chloride: 98 mmol/L (ref 98–111)
Chloride: 99 mmol/L (ref 98–111)
Creatinine, Ser: 0.87 mg/dL (ref 0.44–1.00)
Creatinine, Ser: 0.77 mg/dL (ref 0.44–1.00)
GFR, Estimated: >60 mL/min (ref >60)
GFR, Estimated: >60 mL/min (ref >60)
Glucose, Bld: 142 mg/dL — ABNORMAL HIGH (ref 70–99)
Glucose, Bld: 127 mg/dL — ABNORMAL HIGH (ref 70–99)
Potassium: 3.6 mmol/L (ref 3.5–5.1)
Potassium: 3.8 mmol/L (ref 3.5–5.1)
Sodium: 133 mmol/L — ABNORMAL LOW (ref 135–145)
Sodium: 134 mmol/L — ABNORMAL LOW (ref 135–145)
Total Bilirubin: 0.4 mg/dL (ref <1.2)
Total Bilirubin: 0.4 mg/dL (ref <1.2)
Total Protein: 8.4 g/dL — ABNORMAL HIGH (ref 6.5–8.1)
Total Protein: 7.5 g/dL (ref 6.5–8.1)

## 2023-01-05 LAB — CBC WITH DIFFERENTIAL/PLATELET
Abs Immature Granulocytes: 0.07 10*3/uL (ref 0.00–0.07)
Abs Immature Granulocytes: 0.08 10*3/uL — ABNORMAL HIGH (ref 0.00–0.07)
Basophils Absolute: 0 10*3/uL (ref 0.0–0.1)
Basophils Absolute: 0 10*3/uL (ref 0.0–0.1)
Basophils Relative: 0 %
Basophils Relative: 0 %
Eosinophils Absolute: 0 10*3/uL (ref 0.0–0.5)
Eosinophils Absolute: 0 10*3/uL (ref 0.0–0.5)
Eosinophils Relative: 0 %
Eosinophils Relative: 0 %
HCT: 48.9 % — ABNORMAL HIGH (ref 36.0–46.0)
HCT: 42.3 % (ref 36.0–46.0)
Hemoglobin: 15.9 g/dL — ABNORMAL HIGH (ref 12.0–15.0)
Hemoglobin: 13.9 g/dL (ref 12.0–15.0)
Immature Granulocytes: 1 %
Immature Granulocytes: 1 %
Lymphocytes Relative: 5 %
Lymphocytes Relative: 4 %
Lymphs Abs: 0.7 10*3/uL (ref 0.7–4.0)
Lymphs Abs: 0.7 10*3/uL (ref 0.7–4.0)
MCH: 25.8 pg — ABNORMAL LOW (ref 26.0–34.0)
MCH: 26 pg (ref 26.0–34.0)
MCHC: 32.5 g/dL (ref 30.0–36.0)
MCHC: 32.9 g/dL (ref 30.0–36.0)
MCV: 79.3 fL — ABNORMAL LOW (ref 80.0–100.0)
MCV: 79.2 fL — ABNORMAL LOW (ref 80.0–100.0)
Monocytes Absolute: 0.7 10*3/uL (ref 0.1–1.0)
Monocytes Absolute: 1.2 10*3/uL — ABNORMAL HIGH (ref 0.1–1.0)
Monocytes Relative: 5 %
Monocytes Relative: 7 %
Neutro Abs: 12.7 10*3/uL — ABNORMAL HIGH (ref 1.7–7.7)
Neutro Abs: 15.1 10*3/uL — ABNORMAL HIGH (ref 1.7–7.7)
Neutrophils Relative %: 89 %
Neutrophils Relative %: 88 %
Platelets: 284 10*3/uL (ref 150–400)
Platelets: 294 10*3/uL (ref 150–400)
RBC: 6.17 MIL/uL — ABNORMAL HIGH (ref 3.87–5.11)
RBC: 5.34 MIL/uL — ABNORMAL HIGH (ref 3.87–5.11)
RDW: 15.7 % — ABNORMAL HIGH (ref 11.5–15.5)
RDW: 15.4 % (ref 11.5–15.5)
WBC: 14.2 10*3/uL — ABNORMAL HIGH (ref 4.0–10.5)
WBC: 17.2 10*3/uL — ABNORMAL HIGH (ref 4.0–10.5)
nRBC: 0 % (ref 0.0–0.2)
nRBC: 0 % (ref 0.0–0.2)

## 2023-01-05 LAB — BLOOD GAS, ARTERIAL
Acid-base deficit: 0.6 mmol/L (ref 0.0–2.0)
Bicarbonate: 23.2 mmol/L (ref 20.0–28.0)
Expiratory PAP: 5 cm[H2O]
FIO2: 30 %
Inspiratory PAP: 5 cm[H2O]
O2 Saturation: 98 %
Patient temperature: 37
pCO2 arterial: 35 mm[Hg] (ref 32–48)
pH, Arterial: 7.43 (ref 7.35–7.45)
pO2, Arterial: 95 mm[Hg] (ref 83–108)

## 2023-01-05 LAB — CK: Total CK: 333 U/L — ABNORMAL HIGH (ref 38–234)

## 2023-01-05 LAB — GLUCOSE, CAPILLARY
Glucose-Capillary: 121 mg/dL — ABNORMAL HIGH (ref 70–99)
Glucose-Capillary: 124 mg/dL — ABNORMAL HIGH (ref 70–99)
Glucose-Capillary: 124 mg/dL — ABNORMAL HIGH (ref 70–99)

## 2023-01-05 LAB — URINALYSIS, W/ REFLEX TO CULTURE (INFECTION SUSPECTED)
Bacteria, UA: NONE SEEN
Bilirubin Urine: NEGATIVE
Glucose, UA: NEGATIVE mg/dL
Ketones, ur: NEGATIVE mg/dL
Leukocytes,Ua: NEGATIVE
Nitrite: NEGATIVE
Protein, ur: NEGATIVE mg/dL
Specific Gravity, Urine: 1.02 (ref 1.005–1.030)
Squamous Epithelial / HPF: 0 /[HPF] (ref 0–5)
pH: 5 (ref 5.0–8.0)

## 2023-01-05 LAB — PROTIME-INR
INR: 0.9 (ref 0.8–1.2)
INR: 1 (ref 0.8–1.2)
Prothrombin Time: 12.7 s (ref 11.4–15.2)
Prothrombin Time: 13.7 s (ref 11.4–15.2)

## 2023-01-05 LAB — TROPONIN I (HIGH SENSITIVITY)
Troponin I (High Sensitivity): 24 ng/L — ABNORMAL HIGH (ref <18)
Troponin I (High Sensitivity): 95 ng/L — ABNORMAL HIGH (ref <18)
Troponin I (High Sensitivity): 71 ng/L — ABNORMAL HIGH (ref <18)
Troponin I (High Sensitivity): 57 ng/L — ABNORMAL HIGH (ref <18)

## 2023-01-05 LAB — HEMOGLOBIN A1C
Hgb A1c MFr Bld: 5.7 % — ABNORMAL HIGH (ref 4.8–5.6)
Mean Plasma Glucose: 116.89 mg/dL

## 2023-01-05 LAB — APTT: aPTT: 30 s (ref 24–36)

## 2023-01-05 LAB — MRSA NEXT GEN BY PCR, NASAL: MRSA by PCR Next Gen: NOT DETECTED

## 2023-01-05 LAB — LIPASE, BLOOD: Lipase: 23 U/L (ref 11–51)

## 2023-01-05 LAB — PREPARE RBC (CROSSMATCH)

## 2023-01-05 LAB — PHOSPHORUS: Phosphorus: 3.4 mg/dL (ref 2.5–4.6)

## 2023-01-05 LAB — SARS CORONAVIRUS 2 BY RT PCR: SARS Coronavirus 2 by RT PCR: NEGATIVE

## 2023-01-05 LAB — ETHANOL: Alcohol, Ethyl (B): <10 mg/dL (ref <10)

## 2023-01-05 LAB — MAGNESIUM: Magnesium: 2.3 mg/dL (ref 1.7–2.4)

## 2023-01-05 LAB — ABO/RH: ABO/RH(D): O POS

## 2023-01-05 SURGERY — CRANIOTOMY HEMATOMA EVACUATION SUBDURAL
Anesthesia: General | Site: Head | Laterality: Right

## 2023-01-05 MED ORDER — LIDOCAINE-EPINEPHRINE 1 %-1:100000 IJ SOLN
INTRAMUSCULAR | Status: AC
  Filled 2023-01-05: qty 1

## 2023-01-05 MED ORDER — THROMBIN 5000 UNITS EX SOLR
CUTANEOUS | Status: AC
  Filled 2023-01-05: qty 10000

## 2023-01-05 MED ORDER — PHENYLEPHRINE HCL-NACL 20-0.9 MG/250ML-% IV SOLN
INTRAVENOUS | Status: DC | PRN
  Administered 2023-01-05: 30 ug/min via INTRAVENOUS

## 2023-01-05 MED ORDER — PROPOFOL 10 MG/ML IV BOLUS
INTRAVENOUS | Status: DC | PRN
  Administered 2023-01-05: 30 ug/kg/min via INTRAVENOUS
  Administered 2023-01-05 (×2): 50 mg via INTRAVENOUS

## 2023-01-05 MED ORDER — ORAL CARE MOUTH RINSE
15.0000 mL | OROMUCOSAL | Status: DC
  Administered 2023-01-05 – 2023-01-06 (×14): 15 mL via OROMUCOSAL

## 2023-01-05 MED ORDER — FENTANYL CITRATE PF 50 MCG/ML IJ SOSY
25.0000 ug | PREFILLED_SYRINGE | INTRAMUSCULAR | Status: AC | PRN
  Administered 2023-01-05 – 2023-01-06 (×3): 25 ug via INTRAVENOUS
  Filled 2023-01-05: qty 1

## 2023-01-05 MED ORDER — MANNITOL 20 % IV SOLN
50.0000 g | Freq: Once | INTRAVENOUS | Status: AC
  Filled 2023-01-05: qty 250

## 2023-01-05 MED ORDER — SODIUM CHLORIDE 0.9% IV SOLUTION
Freq: Once | INTRAVENOUS | Status: DC
  Filled 2023-01-05: qty 250

## 2023-01-05 MED ORDER — ETOMIDATE 2 MG/ML IV SOLN
INTRAVENOUS | Status: AC
  Filled 2023-01-05: qty 10

## 2023-01-05 MED ORDER — LABETALOL HCL 5 MG/ML IV SOLN
10.0000 mg | Freq: Once | INTRAVENOUS | Status: AC
  Administered 2023-01-05: 10 mg via INTRAVENOUS

## 2023-01-05 MED ORDER — PROPOFOL 10 MG/ML IV BOLUS
INTRAVENOUS | Status: AC | PRN
  Administered 2023-01-05: 50 mg via INTRAVENOUS

## 2023-01-05 MED ORDER — POLYETHYLENE GLYCOL 3350 17 G PO PACK
17.0000 g | PACK | Freq: Every day | ORAL | Status: DC
  Administered 2023-01-06: 17 g
  Filled 2023-01-05: qty 1

## 2023-01-05 MED ORDER — ROCURONIUM BROMIDE 10 MG/ML (PF) SYRINGE
PREFILLED_SYRINGE | INTRAVENOUS | Status: AC | PRN
  Administered 2023-01-05: 120 mg via INTRAVENOUS

## 2023-01-05 MED ORDER — STROKE: EARLY STAGES OF RECOVERY BOOK: Freq: Once | Status: AC

## 2023-01-05 MED ORDER — PROPOFOL 1000 MG/100ML IV EMUL
INTRAVENOUS | Status: AC
  Administered 2023-01-05: 30 mg
  Filled 2023-01-05: qty 100

## 2023-01-05 MED ORDER — PHENYLEPHRINE HCL-NACL 20-0.9 MG/250ML-% IV SOLN
INTRAVENOUS | Status: AC
  Filled 2023-01-05: qty 250

## 2023-01-05 MED ORDER — ROCURONIUM BROMIDE 10 MG/ML (PF) SYRINGE
PREFILLED_SYRINGE | INTRAVENOUS | Status: AC
  Filled 2023-01-05: qty 10

## 2023-01-05 MED ORDER — CLEVIDIPINE BUTYRATE 0.5 MG/ML IV EMUL
0.0000 mg/h | INTRAVENOUS | Status: DC
  Administered 2023-01-05: 8 mg/h via INTRAVENOUS
  Administered 2023-01-05: 2 mg/h via INTRAVENOUS
  Filled 2023-01-05 (×5): qty 50

## 2023-01-05 MED ORDER — 0.9 % SODIUM CHLORIDE (POUR BTL) OPTIME
TOPICAL | Status: DC | PRN
  Administered 2023-01-05: 1000 mL

## 2023-01-05 MED ORDER — DEXAMETHASONE SODIUM PHOSPHATE 10 MG/ML IJ SOLN
INTRAMUSCULAR | Status: DC | PRN
  Administered 2023-01-05: 10 mg via INTRAVENOUS

## 2023-01-05 MED ORDER — ACETAMINOPHEN 160 MG/5ML PO SOLN: 650.0000 mg | ORAL | Status: DC | PRN

## 2023-01-05 MED ORDER — FENTANYL CITRATE (PF) 100 MCG/2ML IJ SOLN
INTRAMUSCULAR | Status: DC | PRN
  Administered 2023-01-05: 100 ug via INTRAVENOUS

## 2023-01-05 MED ORDER — THROMBIN (RECOMBINANT) 5000 UNITS EX SOLR
CUTANEOUS | Status: DC | PRN
  Administered 2023-01-05: 5000 [IU] via TOPICAL

## 2023-01-05 MED ORDER — DEXTROSE 5 % IV SOLN
INTRAVENOUS | Status: DC | PRN
  Administered 2023-01-05: 3 g via INTRAVENOUS

## 2023-01-05 MED ORDER — PROPOFOL 10 MG/ML IV BOLUS
INTRAVENOUS | Status: AC
  Filled 2023-01-05: qty 20

## 2023-01-05 MED ORDER — FENTANYL CITRATE (PF) 100 MCG/2ML IJ SOLN
INTRAMUSCULAR | Status: AC
  Filled 2023-01-05: qty 2

## 2023-01-05 MED ORDER — ROCURONIUM BROMIDE 100 MG/10ML IV SOLN
INTRAVENOUS | Status: DC | PRN
  Administered 2023-01-05: 20 mg via INTRAVENOUS
  Administered 2023-01-05: 50 mg via INTRAVENOUS
  Administered 2023-01-05: 30 mg via INTRAVENOUS
  Administered 2023-01-05: 20 mg via INTRAVENOUS

## 2023-01-05 MED ORDER — BACITRACIN ZINC 500 UNIT/GM EX OINT
TOPICAL_OINTMENT | CUTANEOUS | Status: AC
  Filled 2023-01-05: qty 28.35

## 2023-01-05 MED ORDER — GELATIN ABSORBABLE 12-7 MM EX MISC
CUTANEOUS | Status: DC | PRN
  Administered 2023-01-05: 2

## 2023-01-05 MED ORDER — CHLORHEXIDINE GLUCONATE CLOTH 2 % EX PADS
6.0000 | MEDICATED_PAD | Freq: Every day | CUTANEOUS | Status: DC
  Administered 2023-01-05 – 2023-01-06 (×2): 6 via TOPICAL

## 2023-01-05 MED ORDER — VASOPRESSIN 20 UNIT/ML IV SOLN
INTRAVENOUS | Status: AC
  Filled 2023-01-05: qty 1

## 2023-01-05 MED ORDER — IOHEXOL 350 MG/ML SOLN
75.0000 mL | Freq: Once | INTRAVENOUS | Status: AC | PRN
  Administered 2023-01-05: 75 mL via INTRAVENOUS

## 2023-01-05 MED ORDER — ESMOLOL HCL 100 MG/10ML IV SOLN
INTRAVENOUS | Status: DC | PRN
  Administered 2023-01-05 (×2): 50 mg via INTRAVENOUS

## 2023-01-05 MED ORDER — FENTANYL CITRATE PF 50 MCG/ML IJ SOSY
25.0000 ug | PREFILLED_SYRINGE | INTRAMUSCULAR | Status: DC | PRN
  Administered 2023-01-05 – 2023-01-06 (×8): 50 ug via INTRAVENOUS
  Filled 2023-01-05 (×6): qty 1
  Filled 2023-01-05: qty 2
  Filled 2023-01-05: qty 1

## 2023-01-05 MED ORDER — MANNITOL 20 % IV SOLN
INTRAVENOUS | Status: AC
  Filled 2023-01-05: qty 500

## 2023-01-05 MED ORDER — ACETAMINOPHEN 650 MG RE SUPP: 650.0000 mg | RECTAL | Status: DC | PRN

## 2023-01-05 MED ORDER — LABETALOL HCL 5 MG/ML IV SOLN
INTRAVENOUS | Status: AC
  Filled 2023-01-05: qty 4

## 2023-01-05 MED ORDER — GLYCOPYRROLATE 0.2 MG/ML IJ SOLN
INTRAMUSCULAR | Status: DC | PRN
  Administered 2023-01-05: .2 mg via INTRAVENOUS

## 2023-01-05 MED ORDER — BACITRACIN 500 UNIT/GM EX OINT
TOPICAL_OINTMENT | CUTANEOUS | Status: DC | PRN
  Administered 2023-01-05 (×2): 1 via TOPICAL

## 2023-01-05 MED ORDER — GELATIN ABSORBABLE 12-7 MM EX MISC
CUTANEOUS | Status: AC
  Filled 2023-01-05: qty 2

## 2023-01-05 MED ORDER — ACETAMINOPHEN 325 MG PO TABS
650.0000 mg | ORAL_TABLET | ORAL | Status: DC | PRN
  Administered 2023-01-06: 650 mg
  Filled 2023-01-05 (×2): qty 2

## 2023-01-05 MED ORDER — PROPOFOL 1000 MG/100ML IV EMUL
5.0000 ug/kg/min | INTRAVENOUS | Status: DC
  Administered 2023-01-05: 5 ug/kg/min via INTRAVENOUS

## 2023-01-05 MED ORDER — ORAL CARE MOUTH RINSE: 15.0000 mL | OROMUCOSAL | Status: DC | PRN

## 2023-01-05 MED ORDER — PHENYLEPHRINE 80 MCG/ML (10ML) SYRINGE FOR IV PUSH (FOR BLOOD PRESSURE SUPPORT)
PREFILLED_SYRINGE | INTRAVENOUS | Status: DC | PRN
  Administered 2023-01-05 (×5): 160 ug via INTRAVENOUS

## 2023-01-05 MED ORDER — ETOMIDATE 2 MG/ML IV SOLN
INTRAVENOUS | Status: AC | PRN
  Administered 2023-01-05: 36 mg via INTRAVENOUS

## 2023-01-05 MED ORDER — SODIUM CHLORIDE (PF) 0.9 % IJ SOLN
INTRAMUSCULAR | Status: AC
  Filled 2023-01-05: qty 50

## 2023-01-05 MED ORDER — SURGIFLO WITH THROMBIN (HEMOSTATIC MATRIX KIT) OPTIME
TOPICAL | Status: DC | PRN
  Administered 2023-01-05: 2 via TOPICAL

## 2023-01-05 MED ORDER — SODIUM CHLORIDE 0.9 % IV SOLN: INTRAVENOUS | Status: DC | PRN

## 2023-01-05 MED ORDER — LEVETIRACETAM IN NACL 1000 MG/100ML IV SOLN
1000.0000 mg | Freq: Once | INTRAVENOUS | Status: AC
  Administered 2023-01-05: 1000 mg via INTRAVENOUS
  Filled 2023-01-05: qty 100

## 2023-01-05 MED ORDER — PROPOFOL 1000 MG/100ML IV EMUL
5.0000 ug/kg/min | INTRAVENOUS | Status: DC
  Administered 2023-01-05: 15 ug/kg/min via INTRAVENOUS
  Administered 2023-01-06 (×2): 25 ug/kg/min via INTRAVENOUS
  Administered 2023-01-06: 20 ug/kg/min via INTRAVENOUS
  Administered 2023-01-06: 10 ug/kg/min via INTRAVENOUS
  Filled 2023-01-05 (×5): qty 100

## 2023-01-05 MED ADMIN — Pantoprazole Sodium For IV Soln 40 MG (Base Equiv): 40 mg | INTRAVENOUS | NDC 00008092351

## 2023-01-05 MED ADMIN — Docusate Sodium Liquid 150 MG/15ML: 100 mg | NDC 00904727966

## 2023-01-05 MED FILL — Docusate Sodium Liquid 150 MG/15ML: 100.0000 mg | ORAL | Qty: 10 | Status: AC

## 2023-01-05 MED FILL — Pantoprazole Sodium For IV Soln 40 MG (Base Equiv): 40.0000 mg | INTRAVENOUS | Qty: 10 | Status: AC

## 2023-01-05 SURGICAL SUPPLY — 74 items
APPLICATOR CHLORAPREP 10.5 ORG (MISCELLANEOUS) ×2 IMPLANT
BASIN GRAD PLASTIC 32OZ STRL (MISCELLANEOUS) ×1 IMPLANT
BLADE CLIPPER SPEC (BLADE) ×1 IMPLANT
BLADE SURG 15 STRL LF DISP TIS (BLADE) ×1 IMPLANT
BUR ACORN 7.5 PRECISION (BURR) ×1 IMPLANT
BUR SPIRAL ROUTER 2.3 (BUR) ×1 IMPLANT
CNTNR URN SCR LID CUP LEK RST (MISCELLANEOUS) ×3 IMPLANT
COUNTER NEEDLE 20/40 LG (NEEDLE) ×1 IMPLANT
DRAIN CHANNEL JP 10F RND 20C F (MISCELLANEOUS) IMPLANT
DRAIN WOUND RND W/TROCAR (DRAIN) IMPLANT
DRAPE INCISE 23X17 STRL (DRAPES) ×2 IMPLANT
DRAPE INCISE IOBAN 23X17 STRL (DRAPES) ×1 IMPLANT
DRAPE INCISE IOBAN 66X45 STRL (DRAPES) ×1 IMPLANT
DRAPE SURG 17X11 SM STRL (DRAPES) ×4 IMPLANT
DRAPE WARM FLUID 44X44 (DRAPES) ×1 IMPLANT
DRSG TEGADERM 4X4.75 (GAUZE/BANDAGES/DRESSINGS) ×1 IMPLANT
ELECT CAUTERY BLADE TIP 2.5 (TIP) ×1
ELECT REM PT RETURN 9FT ADLT (ELECTROSURGICAL) ×1
ELECTRODE CAUTERY BLDE TIP 2.5 (TIP) ×1 IMPLANT
ELECTRODE REM PT RTRN 9FT ADLT (ELECTROSURGICAL) ×1 IMPLANT
EVACUATOR SILICONE 100CC (DRAIN) IMPLANT
GAUZE 4X4 16PLY ~~LOC~~+RFID DBL (SPONGE) ×3 IMPLANT
GAUZE SPONGE 4X4 12PLY STRL (GAUZE/BANDAGES/DRESSINGS) IMPLANT
GAUZE XEROFORM 1X8 LF (GAUZE/BANDAGES/DRESSINGS) ×1 IMPLANT
GLOVE BIOGEL PI IND STRL 6.5 (GLOVE) ×1 IMPLANT
GLOVE BIOGEL PI IND STRL 8.5 (GLOVE) ×2 IMPLANT
GLOVE SURG SYN 6.5 ES PF (GLOVE) IMPLANT
GLOVE SURG SYN 6.5 PF PI (GLOVE) ×1 IMPLANT
GLOVE SURG SYN 8.5 E (GLOVE) ×4 IMPLANT
GLOVE SURG SYN 8.5 PF PI (GLOVE) ×4 IMPLANT
GOWN SRG LRG LVL 4 IMPRV REINF (GOWNS) ×1 IMPLANT
GOWN SRG XL LVL 3 NONREINFORCE (GOWNS) ×1 IMPLANT
GRADUATE 1200CC STRL 31836 (MISCELLANEOUS) ×1 IMPLANT
GRAFT DURAGEN MATRIX 2WX2L IMPLANT
HEMOSTAT SURGICEL 2X14 (HEMOSTASIS) IMPLANT
HEMOSTAT SURGICEL 2X3 (HEMOSTASIS) IMPLANT
HEMOSTAT SURGICEL 4X8 (HEMOSTASIS) IMPLANT
HOLDER FOLEY CATH W/STRAP (MISCELLANEOUS) ×1 IMPLANT
HOOK STAY BLUNT/RETRACTOR 5M (MISCELLANEOUS) ×1 IMPLANT
KIT DRAIN CSF ACCUDRAIN (MISCELLANEOUS) IMPLANT
KIT TURNOVER KIT A (KITS) ×1 IMPLANT
MANIFOLD NEPTUNE II (INSTRUMENTS) ×1 IMPLANT
MARKER SKIN DUAL TIP RULER LAB (MISCELLANEOUS) ×2 IMPLANT
MAT ABSORB FLUID 56X50 GRAY (MISCELLANEOUS) ×1 IMPLANT
NDL HYPO 22X1.5 SAFETY MO (MISCELLANEOUS) ×1 IMPLANT
NEEDLE HYPO 22X1.5 SAFETY MO (MISCELLANEOUS) ×1 IMPLANT
PACK CRANIOTOMY CUSTOM (CUSTOM PROCEDURE TRAY) ×1 IMPLANT
PAD ARMBOARD 7.5X6 YLW CONV (MISCELLANEOUS) ×2 IMPLANT
PATTIES SURGICAL .5 X3 (DISPOSABLE) IMPLANT
PATTIES SURGICAL 1X1 (DISPOSABLE) IMPLANT
PIN MAYFIELD SKULL DISP (PIN) IMPLANT
PLATE 1.5/0.5 18.5MM BURR HOLE (Plate) IMPLANT
SCREW SELF DRILL HT 1.5/4MM (Screw) IMPLANT
SET CATH VENT DRAIN 3-15 1.9D (DRAIN) IMPLANT
SHEET NEURO XL SOL CTL (MISCELLANEOUS) ×1 IMPLANT
SOL PREP PVP 2OZ (MISCELLANEOUS) ×1
SOL SCRUB PVP POV-IOD 4OZ 7.5% (MISCELLANEOUS) ×1
SOLUTION PREP PVP 2OZ (MISCELLANEOUS) ×1 IMPLANT
SOLUTION SCRB POV-IOD 4OZ 7.5% (MISCELLANEOUS) ×1 IMPLANT
SPONGE NEURO XRAY DETECT 1X3 (DISPOSABLE) IMPLANT
STAPLER SKIN PROX 35W (STAPLE) ×2 IMPLANT
SURGIFLO W/THROMBIN 8M KIT (HEMOSTASIS) ×2 IMPLANT
SURGILUBE 2OZ TUBE FLIPTOP (MISCELLANEOUS) ×1 IMPLANT
SUT NURALON 4 0 TR CR/8 (SUTURE) ×1 IMPLANT
SUT SILK 2 0 SH (SUTURE) IMPLANT
SUT VIC AB 2-0 CT1 18 (SUTURE) ×2 IMPLANT
SYR 10ML LL (SYRINGE) ×1 IMPLANT
SYR 20ML LL LF (SYRINGE) ×2 IMPLANT
TAPE CLOTH 3X10 WHT NS LF (GAUZE/BANDAGES/DRESSINGS) ×1 IMPLANT
TOWEL OR 17X26 4PK STRL BLUE (TOWEL DISPOSABLE) ×4 IMPLANT
TRAP FLUID SMOKE EVACUATOR (MISCELLANEOUS) ×1 IMPLANT
TRAY FOLEY SLVR 16FR TEMP STAT (SET/KITS/TRAYS/PACK) ×1 IMPLANT
WATER STERILE IRR 1000ML POUR (IV SOLUTION) ×3 IMPLANT
WATER STERILE IRR 500ML POUR (IV SOLUTION) ×1 IMPLANT

## 2023-01-06 ENCOUNTER — Other Ambulatory Visit: Payer: Self-pay

## 2023-01-06 ENCOUNTER — Inpatient Hospital Stay: Payer: Medicare PPO

## 2023-01-06 ENCOUNTER — Encounter: Payer: Self-pay | Admitting: Neurosurgery

## 2023-01-06 ENCOUNTER — Ambulatory Visit: Payer: Medicare PPO

## 2023-01-06 DIAGNOSIS — R569 Unspecified convulsions: Secondary | ICD-10-CM | POA: Diagnosis not present

## 2023-01-06 DIAGNOSIS — S0631AA Contusion and laceration of right cerebrum with loss of consciousness status unknown, initial encounter: Secondary | ICD-10-CM | POA: Diagnosis not present

## 2023-01-06 DIAGNOSIS — J9601 Acute respiratory failure with hypoxia: Secondary | ICD-10-CM | POA: Diagnosis not present

## 2023-01-06 LAB — GLUCOSE, CAPILLARY
Glucose-Capillary: 131 mg/dL — ABNORMAL HIGH (ref 70–99)
Glucose-Capillary: 119 mg/dL — ABNORMAL HIGH (ref 70–99)
Glucose-Capillary: 114 mg/dL — ABNORMAL HIGH (ref 70–99)
Glucose-Capillary: 128 mg/dL — ABNORMAL HIGH (ref 70–99)
Glucose-Capillary: 102 mg/dL — ABNORMAL HIGH (ref 70–99)

## 2023-01-06 LAB — BASIC METABOLIC PANEL
Anion gap: 11 (ref 5–15)
Anion gap: 8 (ref 5–15)
BUN: 19 mg/dL (ref 8–23)
BUN: 22 mg/dL (ref 8–23)
CO2: 22 mmol/L (ref 22–32)
CO2: 21 mmol/L — ABNORMAL LOW (ref 22–32)
Calcium: 8.2 mg/dL — ABNORMAL LOW (ref 8.9–10.3)
Calcium: 7.6 mg/dL — ABNORMAL LOW (ref 8.9–10.3)
Chloride: 103 mmol/L (ref 98–111)
Chloride: 103 mmol/L (ref 98–111)
Creatinine, Ser: 1.2 mg/dL — ABNORMAL HIGH (ref 0.44–1.00)
Creatinine, Ser: 0.99 mg/dL (ref 0.44–1.00)
GFR, Estimated: 48 mL/min — ABNORMAL LOW (ref >60)
GFR, Estimated: >60 mL/min (ref >60)
Glucose, Bld: 126 mg/dL — ABNORMAL HIGH (ref 70–99)
Glucose, Bld: 121 mg/dL — ABNORMAL HIGH (ref 70–99)
Potassium: 3.5 mmol/L (ref 3.5–5.1)
Potassium: 3.6 mmol/L (ref 3.5–5.1)
Sodium: 136 mmol/L (ref 135–145)
Sodium: 132 mmol/L — ABNORMAL LOW (ref 135–145)

## 2023-01-06 LAB — URINE DRUG SCREEN, QUALITATIVE (ARMC ONLY)
Amphetamines, Ur Screen: NOT DETECTED
Barbiturates, Ur Screen: NOT DETECTED
Benzodiazepine, Ur Scrn: NOT DETECTED
Cannabinoid 50 Ng, Ur ~~LOC~~: NOT DETECTED
Cocaine Metabolite,Ur ~~LOC~~: NOT DETECTED
MDMA (Ecstasy)Ur Screen: NOT DETECTED
Methadone Scn, Ur: NOT DETECTED
Opiate, Ur Screen: NOT DETECTED
Phencyclidine (PCP) Ur S: NOT DETECTED
Tricyclic, Ur Screen: NOT DETECTED

## 2023-01-06 LAB — CBC
HCT: 39.8 % (ref 36.0–46.0)
Hemoglobin: 13.1 g/dL (ref 12.0–15.0)
MCH: 26.2 pg (ref 26.0–34.0)
MCHC: 32.9 g/dL (ref 30.0–36.0)
MCV: 79.6 fL — ABNORMAL LOW (ref 80.0–100.0)
Platelets: 264 10*3/uL (ref 150–400)
RBC: 5 MIL/uL (ref 3.87–5.11)
RDW: 15.4 % (ref 11.5–15.5)
WBC: 23.9 10*3/uL — ABNORMAL HIGH (ref 4.0–10.5)
nRBC: 0 % (ref 0.0–0.2)

## 2023-01-06 LAB — PROCALCITONIN: Procalcitonin: 0.14 ng/mL

## 2023-01-06 LAB — TRIGLYCERIDES: Triglycerides: 79 mg/dL (ref <150)

## 2023-01-06 LAB — PHOSPHORUS: Phosphorus: 3.7 mg/dL (ref 2.5–4.6)

## 2023-01-06 LAB — MAGNESIUM: Magnesium: 2.3 mg/dL (ref 1.7–2.4)

## 2023-01-06 MED ORDER — LEVETIRACETAM IN NACL 1000 MG/100ML IV SOLN
1000.0000 mg | Freq: Two times a day (BID) | INTRAVENOUS | Status: DC
  Administered 2023-01-06: 1000 mg via INTRAVENOUS
  Filled 2023-01-06 (×2): qty 100

## 2023-01-06 MED ORDER — FENTANYL CITRATE PF 50 MCG/ML IJ SOSY
50.0000 ug | PREFILLED_SYRINGE | Freq: Once | INTRAMUSCULAR | Status: AC
  Administered 2023-01-06: 50 ug via INTRAVENOUS

## 2023-01-06 MED ORDER — SODIUM CHLORIDE 0.9 % IV SOLN
750.0000 mg | Freq: Two times a day (BID) | INTRAVENOUS | Status: DC
  Administered 2023-01-06: 750 mg via INTRAVENOUS
  Filled 2023-01-06: qty 7.5

## 2023-01-06 MED ORDER — FENTANYL CITRATE PF 50 MCG/ML IJ SOSY
25.0000 ug | PREFILLED_SYRINGE | INTRAMUSCULAR | Status: DC | PRN
  Administered 2023-01-06 (×3): 25 ug via INTRAVENOUS
  Administered 2023-01-06: 50 ug via INTRAVENOUS
  Administered 2023-01-06: 25 ug via INTRAVENOUS
  Filled 2023-01-06 (×4): qty 1

## 2023-01-06 MED ORDER — MANNITOL 20 % IV SOLN: 1.0000 g/kg | Freq: Once | INTRAVENOUS | Status: DC

## 2023-01-06 MED ORDER — SODIUM CHLORIDE 0.9 % IV SOLN
2.0000 g | INTRAVENOUS | Status: DC
  Administered 2023-01-06: 2 g via INTRAVENOUS
  Filled 2023-01-06: qty 20

## 2023-01-06 MED ORDER — MANNITOL 20 % IV SOLN
1.0000 g/kg | Freq: Once | INTRAVENOUS | Status: AC
  Administered 2023-01-06: 125 g via INTRAVENOUS
  Filled 2023-01-06: qty 750

## 2023-01-06 MED ORDER — DEXMEDETOMIDINE HCL IN NACL 400 MCG/100ML IV SOLN
0.0000 ug/kg/h | INTRAVENOUS | Status: DC
  Administered 2023-01-06 (×2): 0.4 ug/kg/h via INTRAVENOUS
  Filled 2023-01-06 (×2): qty 100

## 2023-01-06 MED ORDER — LACTATED RINGERS IV BOLUS
500.0000 mL | Freq: Once | INTRAVENOUS | Status: AC
  Administered 2023-01-06: 500 mL via INTRAVENOUS

## 2023-01-06 MED ORDER — SODIUM CHLORIDE 3 % IV BOLUS
250.0000 mL | Freq: Once | INTRAVENOUS | Status: AC
  Administered 2023-01-06: 250 mL via INTRAVENOUS
  Filled 2023-01-06: qty 500

## 2023-01-06 MED ADMIN — Pantoprazole Sodium For IV Soln 40 MG (Base Equiv): 40 mg | INTRAVENOUS | NDC 00008092351

## 2023-01-06 MED ADMIN — Docusate Sodium Liquid 150 MG/15ML: 100 mg | NDC 00904727966

## 2023-01-08 ENCOUNTER — Encounter: Payer: Self-pay | Admitting: Neurosurgery

## 2023-01-14 NOTE — Telephone Encounter (Signed)
Per message from Dr Adriana Simas, she is being discharged to rehab and will likely need staples out in a week. I informed him that the pt is already scheduled w/ [facility] on 01/19/23.

## 2023-01-17 ENCOUNTER — Inpatient Hospital Stay
Admission: RE | Admit: 2023-01-17 | Discharge: 2023-01-17 | Disposition: A | Discharge status: Home / Self Care | Payer: Self-pay | Source: Ambulatory Visit | Attending: Neurosurgery | Admitting: Neurosurgery

## 2023-01-17 DIAGNOSIS — Z049 Encounter for examination and observation for unspecified reason: Secondary | ICD-10-CM

## 2023-01-17 NOTE — Telephone Encounter (Signed)
Per Luther Parody: "Patient's sister called and requested an appointment for tomorrow instead of Wednesday due to her children being able to bring her to the appointment. Stacy had an opening at 11:30. "

## 2023-01-17 NOTE — Telephone Encounter (Signed)
Images from [facility] have been loaded to her chart. The notes and reports from [facility] are not showing up in care everywhere at this time. Per Dr Adriana Simas, she has 2 [facility] charts and they are working on merging them.

## 2023-01-17 NOTE — Progress Notes (Unsigned)
   REFERRING PHYSICIAN:  No referring provider defined for this encounter.  DOS: 01/05/23  Right occipital craniotomy for evacuation of hematoma   HISTORY OF PRESENT ILLNESS: Sharon Ayala is almost 2 weeks status post above surgery.   She has been at home for 2 days. She is doing much better! She is not having any significant dizziness, headaches, or pain. She does not some vision changes- her peripheral vision is blurry.   She is seeing HH.    PHYSICAL EXAMINATION:  NEUROLOGICAL:  General: In no acute distress.   Awake, alert, oriented to person, place, and time.  Facial tone is symmetric.    CRANIAL NERVES: Visual acuity and visual fields are intact- she has some blurriness on peripheral vision (I was holding up 2 fingers, she saw 1 on her left, I was holding up 3 fingers, she saw 2 on her right). Extraocular muscles are intact  Facial sensation is intact bilaterally  Facial strength is intact bilaterally  Shoulder shrug strength is intact  Tongue protrudes midline   Strength: Side Biceps Triceps Deltoid Interossei Grip Wrist Ext. Wrist Flex.  R 5 5 5 5 5 5 5   L 5 5 5 5 5 5 5    Side Iliopsoas Quads Hamstring PF DF EHL  R 5 5 5 5 5 5   L 5 5 5 5 5 5    Incision c/d/I, sutures were removed.   Imaging:  Nothing new to review.   Assessment / Plan: Sharon Ayala is doing well s/p above surgery. Treatment options reviewed with patient and following plan made:   - Reviewed wound care.  - Agree with HH.  - Discussed that peripheral vision should improve with time. Consider referral to ophthalmology if it does not.  - Follow up in 4 weeks with Dr. Myer Haff. She will call to schedule repeat head CT prior to this visit.   Patient reviewed with Dr. Myer Haff and he agrees with above.   Advised to contact the office if any questions or concerns arise.   Drake Leach PA-C Dept of Neurosurgery

## 2023-01-18 ENCOUNTER — Encounter: Payer: Self-pay | Admitting: Orthopedic Surgery

## 2023-01-18 ENCOUNTER — Ambulatory Visit: Payer: Medicare PPO | Admitting: Orthopedic Surgery

## 2023-01-18 VITALS — BP 130/78 | Temp 97.7°F | Ht 64.0 in | Wt 249.0 lb

## 2023-01-18 DIAGNOSIS — I615 Nontraumatic intracerebral hemorrhage, intraventricular: Secondary | ICD-10-CM

## 2023-01-18 DIAGNOSIS — Z9889 Other specified postprocedural states: Secondary | ICD-10-CM

## 2023-01-18 NOTE — Patient Instructions (Signed)
It was nice to see you today.   I am glad that you are feeling better!   Okay to get incision wet in the shower. You can wash your hair, but do not scrub over incisions.   Call if any concerns about the incision such as redness, drainage, or fever/chills.   Call radiology scheduling at [facility] at [phone] to schedule a follow up head CT prior to your next appointment. CT should be done around 02/02/23 to 02/16/23 (prior to your follow up with Dr. Myer Haff on 02/17/23).   We will see you back in 4 weeks for your 6 weeks postop visit.   Please call with any questions or concerns.   Drake Leach PA-C [phone]     The physicians and staff at [facility] at [facility] are committed to providing excellent care. You may receive a survey asking for feedback about your experience at our office. We value you your feedback and appreciate you taking the time to to fill it out. The [facility] leadership team is also available to discuss your experience in person, feel free to contact us [phone].

## 2023-01-19 ENCOUNTER — Encounter: Payer: Medicare PPO | Admitting: Physician Assistant

## 2023-02-14 ENCOUNTER — Telehealth: Payer: Self-pay | Admitting: Neurosurgery

## 2023-02-14 MED ORDER — METOPROLOL SUCCINATE ER 25 MG PO TB24: 12.5000 mg | ORAL_TABLET | Freq: Every day | ORAL | 0 refills | Status: AC

## 2023-02-14 MED ORDER — AMLODIPINE BESYLATE 10 MG PO TABS: 10.0000 mg | ORAL_TABLET | Freq: Every day | ORAL | 0 refills | Status: AC

## 2023-02-14 NOTE — Telephone Encounter (Signed)
 Patient was scheduled for post op appointment on 12/11 that was cancelled. It has been rescheduled for 1/9. She has still not made a PCP appointment with [facility]. She is waiting on a return call. I know BP medication is not normally something we would prescribe. Just want to be certain because of her recent surgery and complications.

## 2023-02-14 NOTE — Telephone Encounter (Signed)
 crani on 01/05/23  She is completely out of the her blood pressure medications. She took her last dose today. She is  currently trying to establish care at [facility]. She is waiting to get a call back from that office. Can Dr.Yarbrough give her a refill to her through until then? Metoprolol  25mg  tablet that she cuts in half, every AM Amlodipine  10mg  tablet every AM [facility]

## 2023-02-14 NOTE — Telephone Encounter (Signed)
 I called patient to let her know that Dr. Clois will refill her medications for a 90 day supply. He is also going to send in a PCP referral for her tomorrow while he is in clinic. She was very adult nurse. She also asked about her appointment tomorrow for her CT head. Informed her that if possible keeping this appointment would be neccessary to follow up with Dr. Clois on 02/17/2023.

## 2023-02-15 ENCOUNTER — Ambulatory Visit
Admission: RE | Admit: 2023-02-15 | Discharge: 2023-02-15 | Disposition: A | Discharge status: Home / Self Care | Payer: Medicare PPO | Source: Ambulatory Visit | Attending: Neurosurgery | Admitting: Neurosurgery

## 2023-02-15 DIAGNOSIS — I615 Nontraumatic intracerebral hemorrhage, intraventricular: Secondary | ICD-10-CM | POA: Diagnosis present

## 2023-02-17 ENCOUNTER — Ambulatory Visit (INDEPENDENT_AMBULATORY_CARE_PROVIDER_SITE_OTHER): Payer: Medicare PPO | Admitting: Neurosurgery

## 2023-02-17 ENCOUNTER — Encounter: Payer: Self-pay | Admitting: Neurosurgery

## 2023-02-17 VITALS — BP 132/78 | Ht 64.0 in | Wt 249.0 lb

## 2023-02-17 DIAGNOSIS — Z9889 Other specified postprocedural states: Secondary | ICD-10-CM

## 2023-02-17 DIAGNOSIS — I615 Nontraumatic intracerebral hemorrhage, intraventricular: Secondary | ICD-10-CM

## 2023-02-17 NOTE — Progress Notes (Signed)
   REFERRING PHYSICIAN:  No referring provider defined for this encounter.  DOS: 01/05/23  Right occipital craniotomy for evacuation of hematoma   HISTORY OF PRESENT ILLNESS: Sharon Ayala is status post above surgery.   She is doing extremely well.  She was admitted to rehab, but is now at home.  She has an appointment with her new primary care doctor next week.  Her peripheral vision has improved.  PHYSICAL EXAMINATION:  NEUROLOGICAL:  General: In no acute distress.   Awake, alert, oriented to person, place, and time.  Facial tone is symmetric.    CRANIAL NERVES: Visual acuity and visual fields are intact-  CNI  Strength: Side Biceps Triceps Deltoid Interossei Grip Wrist Ext. Wrist Flex.  R 5 5 5 5 5 5 5   L 5 5 5 5 5 5 5    Side Iliopsoas Quads Hamstring PF DF EHL  R 5 5 5 5 5 5   L 5 5 5 5 5 5    Incision c/d/I  Imaging:  CT reviewed - good resolution of IPH  Assessment / Plan: Sharon Ayala is doing well s/p above surgery.  I am very pleased with her results from surgery.  Will see her back in 6 weeks.  I encouraged her to keep her appointment with her new primary care provider.  I will also refer her to neurology for ongoing follow-up.   Reeves Daisy MD Dept of Neurosurgery

## 2023-03-29 ENCOUNTER — Ambulatory Visit (INDEPENDENT_AMBULATORY_CARE_PROVIDER_SITE_OTHER): Payer: Medicare PPO | Admitting: Neurosurgery

## 2023-03-29 VITALS — BP 128/82 | Temp 98.0°F | Ht 64.0 in | Wt 238.0 lb

## 2023-03-29 DIAGNOSIS — I615 Nontraumatic intracerebral hemorrhage, intraventricular: Secondary | ICD-10-CM

## 2023-03-29 DIAGNOSIS — Z9889 Other specified postprocedural states: Secondary | ICD-10-CM

## 2023-03-29 NOTE — Progress Notes (Signed)
   REFERRING PHYSICIAN:  No referring provider defined for this encounter.  DOS: 01/05/23  Right occipital craniotomy for evacuation of hematoma   HISTORY OF PRESENT ILLNESS:  03/29/23 [name] is a 73 y.o presenting today for 3 month follow up after craniotomy for evacuation of hematoma.  She is doing well at her last visit and it was recommended that she follow-up with neurology. Today she is doing well with minimal complaints.  She does have some tenderness over the distal aspect of her incision but feels this is improving.  She has had significant improvement of her peripheral vision and feels things are back to her baseline.  She is eager to resume driving so that she can return to her normal degree of independence.  02/17/23 Sharon Ayala is status post above surgery.   She is doing extremely well.  She was admitted to rehab, but is now at home.  She has an appointment with her new primary care doctor next week.  Her peripheral vision has improved.  PHYSICAL EXAMINATION:  NEUROLOGICAL:  General: In no acute distress.   Awake, alert, oriented to person, place, and time.  Facial tone is symmetric.    CRANIAL NERVES: Visual acuity and visual fields are intact-  CNI  Strength: Side Biceps Triceps Deltoid Interossei Grip Wrist Ext. Wrist Flex.  R 5 5 5 5 5 5 5   L 5 5 5 5 5 5 5    Side Iliopsoas Quads Hamstring PF DF EHL  R 5 5 5 5 5 5   L 5 5 5 5 5 5    Incision well healed   Imaging:  No interval cranial imaging to review.  Head CT from 02/15/2023 was reassuring.  Assessment / Plan: Sharon Ayala is doing well s/p above surgery.  She has done well and denies any significant complaints today.  I encouraged her to move forward with getting her vision checked since her peripheral deficits have resolved.  I will defer formal clearance for driving to neurology and her PCP.  We will see her going forward on an as-needed basis.  She expressed understanding and was in agreement with this  plan.   Manning Charity PA-C Dept of Neurosurgery

## 2023-04-20 ENCOUNTER — Other Ambulatory Visit: Payer: Self-pay | Admitting: Internal Medicine

## 2023-04-20 DIAGNOSIS — I10 Essential (primary) hypertension: Secondary | ICD-10-CM

## 2023-04-20 DIAGNOSIS — F17210 Nicotine dependence, cigarettes, uncomplicated: Secondary | ICD-10-CM

## 2023-04-20 DIAGNOSIS — Z122 Encounter for screening for malignant neoplasm of respiratory organs: Secondary | ICD-10-CM

## 2023-04-27 NOTE — Progress Notes (Unsigned)
   REFERRING PHYSICIAN:  No referring provider defined for this encounter.  DOS: 01/05/23  Right occipital craniotomy for evacuation of hematoma   HISTORY OF PRESENT ILLNESS:  Last seen by Duwayne Heck on 03/29/23 and she was doing well. She was to get her vision checked as she felt her peripheral vision was back to normal. Driving clearance was deferred to her PCP and neurology.   She is here for follow up- called with some questions about her incision.   On Tuesday, she started with intermittent "twinges" around her incision. She does not have pain. Feels like a butterfly fluttering feeling and it only lasts for a few seconds. This happened twice on Tuesday and she's not felt it since.   She otherwise is doing well. No headaches or dizziness. She is not seeing black spots any longer. Her peripheral vision has improved.    PHYSICAL EXAMINATION:  NEUROLOGICAL:  General: In no acute distress.   Awake, alert, oriented to person, place, and time.  Facial tone is symmetric.    CRANIAL NERVES: Extraocular muscles are intact  She did well on gross peripheral vision testing.  Facial sensation is intact bilaterally  Facial strength is intact bilaterally  Shoulder shrug strength is intact  Tongue protrudes midline   Strength: Side Biceps Triceps Deltoid Interossei Grip Wrist Ext. Wrist Flex.  R 5 5 5 5 5 5 5   L 5 5 5 5 5 5 5    Side Iliopsoas Quads Hamstring PF DF EHL  R 5 5 5 5 5 5   L 5 5 5 5 5 5     Incision well healed. No tenderness noted. No sensitivity noted.   Imaging:  Nothing new to review.   Assessment / Plan: Sharon Ayala is doing well s/p above surgery. Treatment options reviewed with patient and following plan made:   - Above sensations around incision are not concerning and should improve with time.  - She will follow up prn.   Patient reviewed with Dr. Myer Haff and he agrees with above.   Advised to contact the office if any questions or concerns arise.    I  spent a total of 15 minutes in face-to-face and non-face-to-face activities related to this patient's care today including review of outside records, review of imaging, review of symptoms, physical exam, discussion of differential diagnosis, discussion of treatment options, and documentation.   Drake Leach PA-C Dept of Neurosurgery

## 2023-04-28 ENCOUNTER — Encounter: Payer: Self-pay | Admitting: Orthopedic Surgery

## 2023-04-28 ENCOUNTER — Ambulatory Visit: Admitting: Orthopedic Surgery

## 2023-04-28 VITALS — BP 130/78 | Ht 64.0 in | Wt 234.0 lb

## 2023-04-28 DIAGNOSIS — I615 Nontraumatic intracerebral hemorrhage, intraventricular: Secondary | ICD-10-CM

## 2023-04-28 DIAGNOSIS — Z9889 Other specified postprocedural states: Secondary | ICD-10-CM
